# Patient Record
Sex: Male | Born: 1960 | Race: Black or African American | Hispanic: No | Smoking: Never smoker
Health system: Southern US, Community
[De-identification: ages and names within clinical notes are randomized; demographics above are authoritative.]

## PROBLEM LIST (undated history)

## (undated) DIAGNOSIS — I10 Essential (primary) hypertension: Secondary | ICD-10-CM

## (undated) DIAGNOSIS — E119 Type 2 diabetes mellitus without complications: Secondary | ICD-10-CM

---

## 2001-11-02 ENCOUNTER — Emergency Department (HOSPITAL_COMMUNITY): Admission: EM | Admit: 2001-11-02 | Discharge: 2001-11-02 | Payer: Self-pay | Admitting: Emergency Medicine

## 2005-05-23 ENCOUNTER — Emergency Department (HOSPITAL_COMMUNITY): Admission: EM | Admit: 2005-05-23 | Discharge: 2005-05-23 | Payer: Self-pay | Admitting: Family Medicine

## 2005-07-13 ENCOUNTER — Emergency Department (HOSPITAL_COMMUNITY): Admission: EM | Admit: 2005-07-13 | Discharge: 2005-07-13 | Payer: Self-pay | Admitting: Family Medicine

## 2006-01-24 ENCOUNTER — Emergency Department (HOSPITAL_COMMUNITY): Admission: EM | Admit: 2006-01-24 | Discharge: 2006-01-24 | Payer: Self-pay | Admitting: Emergency Medicine

## 2006-03-03 ENCOUNTER — Ambulatory Visit (HOSPITAL_COMMUNITY): Admission: RE | Admit: 2006-03-03 | Discharge: 2006-03-03 | Payer: Self-pay | Admitting: Surgery

## 2006-03-03 ENCOUNTER — Encounter (INDEPENDENT_AMBULATORY_CARE_PROVIDER_SITE_OTHER): Payer: Self-pay | Admitting: Specialist

## 2006-07-23 ENCOUNTER — Emergency Department (HOSPITAL_COMMUNITY): Admission: EM | Admit: 2006-07-23 | Discharge: 2006-07-23 | Payer: Self-pay | Admitting: Emergency Medicine

## 2007-06-08 ENCOUNTER — Emergency Department (HOSPITAL_COMMUNITY): Admission: EM | Admit: 2007-06-08 | Discharge: 2007-06-08 | Payer: Self-pay | Admitting: Family Medicine

## 2008-01-07 ENCOUNTER — Emergency Department (HOSPITAL_COMMUNITY): Admission: EM | Admit: 2008-01-07 | Discharge: 2008-01-07 | Payer: Self-pay | Admitting: Family Medicine

## 2008-04-21 ENCOUNTER — Emergency Department (HOSPITAL_COMMUNITY): Admission: EM | Admit: 2008-04-21 | Discharge: 2008-04-21 | Payer: Self-pay | Admitting: Emergency Medicine

## 2008-07-13 ENCOUNTER — Emergency Department (HOSPITAL_COMMUNITY): Admission: EM | Admit: 2008-07-13 | Discharge: 2008-07-13 | Payer: Self-pay | Admitting: Family Medicine

## 2010-09-03 NOTE — Consult Note (Signed)
NAME:  Ernest Edwards, Ernest Edwards NO.:  [id number]   MEDICAL RECORD NO.:  [id number]          PATIENT TYPE:  EMS   LOCATION:  MAJO                         FACILITY:  [facility]   PHYSICIAN:  Jefry H. Pollyann Kennedy, MD     DATE OF BIRTH:  May 17, 1960   DATE OF CONSULTATION:  DATE OF DISCHARGE:  07/23/2006                                 CONSULTATION   REASON FOR CONSULTATION:  Sore throat, possible peritonsillar abscess.   HISTORY:  This is a 50 year old gentleman with a 6-day history of sore  throat, progressively worsening, mainly on the right side.  No prior  history of throat problems or tonsillitis.  He has been on no  medications.  He has no allergies and takes no medicines.  Has no past  medical history.   PHYSICAL EXAMINATION:  GENERAL:  Healthy appearing gentleman in no  distress.  He has a slight hot potato voice quality.  CERVICAL:  Exam reveals slight fullness of the right upper jugular area  without tenderness.  There is no significant trismus.  The nasal cavity  is clear.  Oral cavity and pharynx reveals edema and fullness of the  right soft palate with displacement of the tonsil and uvula to the left.   UNDERLYING IMPRESSION:  Suspected peritonsillar abscess.   PLAN:  Perform aspiration and incision and drainage if positive.   PROCEDURE NOTE:  Xylocaine with epinephrine was infiltrated into the  right side of the soft palate and anterior fossal arch.  A #18 gauge  needle was used to aspirate the peritonsillar space and about 2 cc of  pus was obtained.  A #11 blade was used to incise the mucosa and a  tonsillar hemostat was used to open the peritonsillar plane completely.  There was no further drainage.  He tolerated this well.   IMPRESSION:  Peritonsillar abscess status post incision and drainage.  He is given a prescription for Augmentin 875 mg p.o. b.i.d. for 10 days.  He is instructed to use Tylenol and/or Motrin for pain.  He is to  followup with me in 2 to 3 days  or sooner if he gets any worse.      Jefry H. Pollyann Kennedy, MD  Electronically Signed     JHR/MEDQ  D:  07/23/2006  T:  07/24/2006  Job:  [id number]

## 2010-09-03 NOTE — Op Note (Signed)
NAME:  Ernest, Edwards NO.:  [id number]   MEDICAL RECORD NO.:  [id number]          PATIENT TYPE:  AMB   LOCATION:  DAY                          FACILITY:  [facility]   PHYSICIAN:  Ardeth Sportsman, MD     DATE OF BIRTH:  12-Sep-1960   DATE OF PROCEDURE:  DATE OF DISCHARGE:                                 OPERATIVE REPORT   SURGEON:  Ardeth Sportsman, MD   ASSISTANT:  Sheppard Plumber. Earlene Plater, M.D.   PREOPERATIVE DIAGNOSIS:  Umbilical hernia.   POSTOPERATIVE DIAGNOSIS:  Incarcerated umbilical hernia.   PROCEDURE PERFORMED:  Open umbilical hernia repair with mesh (Ventralex 6 x  6 cm dual-sided mesh).   ANESTHESIA:  1. General anesthesia.  2. Local anesthetic and field block around the periumbilical region.   SPECIMENS:  Umbilical sac.   DRAINS:  None.   COMPLICATIONS:  None apparent.   ESTIMATED BLOOD LOSS:  Minimal.   INDICATIONS:  [name] is a 50 year old gentleman who has had a chronic  umbilical hernia that has been increasing in size, increasingly symptomatic.  The anatomy embryology of abdominal formation and the pathophysiology of  umbilical herniation with its natural history and risks were discussed in  detail.  The technique of open repair with mesh was discussed versus other  techniques.  Risks such as bleeding, hematoma, wound infection, seroma,  hernia occurrence, injury of bowel or other injuries, hernia, and other  risks were discussed.  Questions were answered.  He wished to proceed.   OPERATIVE FINDINGS:  He had about a 3 x 3 cm (25 x 25 m) circular  periumbilical defect incarcerated with omentum in it.  No evidence of any  strangulation.   DESCRIPTION OF PROCEDURE:  Informed consent was confirmed.  Patient received  IV Acetazolam prior to induction.  He had SCDs active on both lower  extremities.  He underwent general anesthesia without difficulty.  He was  positioned supine with arms out.  Patient's abdomen was prepped and draped  in a  sterile fashion.  Field block was placed with local anesthetic.  A 3 cm  infraumbilical curvilinear incision was done.  Controlled blunt and cautery  dissection was used to circumferentially dissect around the umbilical stalk,  which had an obvious umbilical hernia sac within the stalk itself.  The  stalk was freed off at the level of the fascia and removed off the  umbilicus.  It was sent to pathology.  There was omentum that had been  incarcerated with it, but this is able to be reduced down as the hernia sac  was freed up.  The edges were found to be intact and defect as noted above.   Because of his large size and the above-average size of the umbilical  hernia.  We decided to go ahead and do mesh repair.  A 6 x 6 Ventralex mesh  seemed to be the most appropriate size.  The next size up would be way too  large to actually get good visualization for intraperitoneal repair.  Ventralex was placed in, with rough side on the abdominal wall and the  smooth side on the bottom.  Care was made to make sure there was no exposure  to bowel, and omentum remained inside the peritoneal cavity.  The mesh was  secured to the fascia using 0 Ethibond interrupted stitches x8.  This had  good fascial closure.  The umbilical stalk was reapproximated down to the  inferior edge of the fascia as well as a piece of the mesh as well to good  cosmetic result.  The skin was closed using a 4-0 Monocryl stitch.  Sterile  dressing was applied.  The patient was extubated and sent to the recovery  room in stable condition.   Per the patient's request, I called Ms. Allyne Gee at [phone] and  explained the operative findings and postoperative instructions.  She  expressed understanding and appreciation.      Ardeth Sportsman, MD  Electronically Signed     SCG/MEDQ  D:  03/03/2006  T:  03/03/2006  Job:  [id number]

## 2011-01-07 LAB — I-STAT 8, (EC8 V) (CONVERTED LAB)
Chloride: 102
Glucose, Bld: 359 — ABNORMAL HIGH
Operator id: 116391
Potassium: 4.1
Sodium: 135

## 2011-01-07 LAB — POCT URINALYSIS DIP (DEVICE)
Hgb urine dipstick: NEGATIVE
Ketones, ur: 15 — AB
Urobilinogen, UA: 0.2

## 2011-06-23 ENCOUNTER — Emergency Department (INDEPENDENT_AMBULATORY_CARE_PROVIDER_SITE_OTHER)
Admission: EM | Admit: 2011-06-23 | Discharge: 2011-06-23 | Disposition: A | Discharge status: Home / Self Care | Payer: BC Managed Care – PPO | Source: Home / Self Care | Attending: Family Medicine | Admitting: Family Medicine

## 2011-06-23 ENCOUNTER — Encounter (HOSPITAL_COMMUNITY): Payer: Self-pay | Admitting: Emergency Medicine

## 2011-06-23 DIAGNOSIS — S86119A Strain of other muscle(s) and tendon(s) of posterior muscle group at lower leg level, unspecified leg, initial encounter: Secondary | ICD-10-CM

## 2011-06-23 DIAGNOSIS — S86819A Strain of other muscle(s) and tendon(s) at lower leg level, unspecified leg, initial encounter: Secondary | ICD-10-CM

## 2011-06-23 HISTORY — DX: Essential (primary) hypertension: I10

## 2011-06-23 NOTE — Discharge Instructions (Signed)
Heat to leg, aspirin if desired, stretch, activity as tolerated, return to ER if further problems.

## 2011-06-23 NOTE — ED Provider Notes (Signed)
History     CSN: [id number]  Arrival date & time 06/23/11  1443   First MD Initiated Contact with Patient 06/23/11 1616      Chief Complaint  Patient presents with  . Leg Pain    (Consider location/radiation/quality/duration/timing/severity/associated sxs/prior treatment) Patient is a 51 y.o. male presenting with leg pain. The history is provided by the patient.  Leg Pain  The incident occurred yesterday. The incident occurred at home. There was no injury mechanism (got out of car and when walking had ache /cramp in right calf that lasted few hrs as soreness then resolved, v min soreness now., no risk factors for dvt, does exercise reg.). The pain is present in the right leg. The patient is experiencing no pain. Pertinent negatives include no numbness, no inability to bear weight and no muscle weakness. He reports no foreign bodies present. The symptoms are aggravated by nothing.    Past Medical History  Diagnosis Date  . Hypertension     History reviewed. No pertinent past surgical history.  No family history on file.  History  Substance Use Topics  . Smoking status: Never Smoker   . Smokeless tobacco: Not on file  . Alcohol Use: No      Review of Systems  Constitutional: Negative.   Respiratory: Negative for shortness of breath.   Cardiovascular: Negative for chest pain.  Musculoskeletal: Negative.   Neurological: Negative for numbness.    Allergies  Review of patient's allergies indicates no known allergies.  Home Medications   Current Outpatient Rx  Name Route Sig Dispense Refill  . HYDROCHLOROTHIAZIDE 12.5 MG PO CAPS Oral Take 12.5 mg by mouth daily.      BP 141/95  Pulse 78  Temp(Src) 97.9 F (36.6 C) (Oral)  Resp 18  SpO2 100%  Physical Exam  Nursing note and vitals reviewed. Constitutional: He is oriented to person, place, and time. He appears well-developed and well-nourished.  Musculoskeletal: He exhibits no edema and no tenderness.   Legs: Neurological: He is alert and oriented to person, place, and time.  Skin: Skin is warm and dry.    ED Course  Procedures (including critical care time)  Labs Reviewed - No data to display No results found.   No diagnosis found.    MDM          Barkley Bruns, MD 06/23/11 1728

## 2011-06-23 NOTE — ED Notes (Signed)
PT HERE WITH RIGHT CALF SUDDEN ACHY PAIN AT REST AND AMBULATION THAT STARTED YESTERDAY THEN RESOLVED BUT RESTARTED TODAY.DENIES SOB OR SWELLING.PT STATES HE IS VERY ACTIVE AND DIDN'T DO ANY LOWER BODY EXERCISES AT THE GYM.CONCERNED OF BLOOD CLOT.NO TREATMENT TRIED AT HOME.2/10 PAIN

## 2014-02-21 ENCOUNTER — Emergency Department (HOSPITAL_COMMUNITY)
Admission: EM | Admit: 2014-02-21 | Discharge: 2014-02-21 | Disposition: A | Discharge status: Home / Self Care | Payer: BC Managed Care – PPO | Attending: Emergency Medicine | Admitting: Emergency Medicine

## 2014-02-21 ENCOUNTER — Encounter (HOSPITAL_COMMUNITY): Payer: Self-pay | Admitting: *Deleted

## 2014-02-21 DIAGNOSIS — Z79899 Other long term (current) drug therapy: Secondary | ICD-10-CM | POA: Insufficient documentation

## 2014-02-21 DIAGNOSIS — E1165 Type 2 diabetes mellitus with hyperglycemia: Secondary | ICD-10-CM | POA: Diagnosis present

## 2014-02-21 DIAGNOSIS — I10 Essential (primary) hypertension: Secondary | ICD-10-CM | POA: Insufficient documentation

## 2014-02-21 DIAGNOSIS — R739 Hyperglycemia, unspecified: Secondary | ICD-10-CM

## 2014-02-21 HISTORY — DX: Type 2 diabetes mellitus without complications: E11.9

## 2014-02-21 LAB — URINE MICROSCOPIC-ADD ON

## 2014-02-21 LAB — BASIC METABOLIC PANEL
Anion gap: 12 (ref 5–15)
BUN: 14 mg/dL (ref 6–23)
CALCIUM: 10 mg/dL (ref 8.4–10.5)
CO2: 27 meq/L (ref 19–32)
Chloride: 94 mEq/L — ABNORMAL LOW (ref 96–112)
Creatinine, Ser: 0.95 mg/dL (ref 0.50–1.35)
Glucose, Bld: 352 mg/dL — ABNORMAL HIGH (ref 70–99)
POTASSIUM: 4.7 meq/L (ref 3.7–5.3)
SODIUM: 133 meq/L — AB (ref 137–147)

## 2014-02-21 LAB — CBC WITH DIFFERENTIAL/PLATELET
BASOS ABS: 0 10*3/uL (ref 0.0–0.1)
Basophils Relative: 1 % (ref 0–1)
Eosinophils Absolute: 0.2 10*3/uL (ref 0.0–0.7)
Eosinophils Relative: 4 % (ref 0–5)
HEMATOCRIT: 41.3 % (ref 39.0–52.0)
Hemoglobin: 14.7 g/dL (ref 13.0–17.0)
Lymphocytes Relative: 47 % — ABNORMAL HIGH (ref 12–46)
Lymphs Abs: 2.2 10*3/uL (ref 0.7–4.0)
MCH: 27.6 pg (ref 26.0–34.0)
MCHC: 35.6 g/dL (ref 30.0–36.0)
MCV: 77.5 fL — AB (ref 78.0–100.0)
MONO ABS: 0.4 10*3/uL (ref 0.1–1.0)
Monocytes Relative: 9 % (ref 3–12)
NEUTROS ABS: 1.8 10*3/uL (ref 1.7–7.7)
NEUTROS PCT: 39 % — AB (ref 43–77)
PLATELETS: 250 10*3/uL (ref 150–400)
RBC: 5.33 MIL/uL (ref 4.22–5.81)
RDW: 12.5 % (ref 11.5–15.5)
WBC: 4.5 10*3/uL (ref 4.0–10.5)

## 2014-02-21 LAB — CBG MONITORING, ED
GLUCOSE-CAPILLARY: 325 mg/dL — AB (ref 70–99)
GLUCOSE-CAPILLARY: 237 mg/dL — AB (ref 70–99)

## 2014-02-21 LAB — URINALYSIS, ROUTINE W REFLEX MICROSCOPIC
BILIRUBIN URINE: NEGATIVE
Glucose, UA: >1000 mg/dL — AB
Hgb urine dipstick: NEGATIVE
Ketones, ur: NEGATIVE mg/dL
LEUKOCYTES UA: NEGATIVE
Nitrite: NEGATIVE
PH: 6 (ref 5.0–8.0)
PROTEIN: NEGATIVE mg/dL
SPECIFIC GRAVITY, URINE: 1.037 — AB (ref 1.005–1.030)
Urobilinogen, UA: 0.2 mg/dL (ref 0.0–1.0)

## 2014-02-21 MED ORDER — METFORMIN HCL 500 MG PO TABS: 500.0000 mg | ORAL_TABLET | Freq: Two times a day (BID) | ORAL | Status: DC

## 2014-02-21 MED ORDER — HYDROCHLOROTHIAZIDE 12.5 MG PO CAPS: 12.5000 mg | ORAL_CAPSULE | Freq: Every day | ORAL | Status: DC

## 2014-02-21 MED ORDER — SODIUM CHLORIDE 0.9 % IV BOLUS (SEPSIS)
1000.0000 mL | Freq: Once | INTRAVENOUS | Status: AC
Start: 2014-02-21 — End: 2014-02-21
  Administered 2014-02-21: 1000 mL via INTRAVENOUS

## 2014-02-21 NOTE — ED Provider Notes (Signed)
CSN: [id number]     Arrival date & time 02/21/14  0845 History   First MD Initiated Contact with Patient 02/21/14 [phone]     Chief Complaint  Patient presents with  . Hyperglycemia     (Consider location/radiation/quality/duration/timing/severity/associated sxs/prior Treatment) HPI Comments: Patient with a history of DM presents today with a chief complaint of hyperglycemia.  He reports that he typically checks his blood sugar once a month.  Blood sugars typically run in the 200's.  Yesterday he checked it and it was 440.  He had been on Metformin previously, but stopped taking it four months ago.  He also reports that he stopped taking his blood pressure medication four months ago.  He reports that he has been trying to control his DM and HTN with diet and exercise.  He denies nausea, vomiting, diarrhea, SOB, chest pain, headache, fever, chills, or vision changes.    The history is provided by the patient.    Past Medical History  Diagnosis Date  . Hypertension   . Diabetes mellitus without complication    History reviewed. No pertinent past surgical history. History reviewed. No pertinent family history. History  Substance Use Topics  . Smoking status: Never Smoker   . Smokeless tobacco: Not on file  . Alcohol Use: No    Review of Systems  All other systems reviewed and are negative.     Allergies  Review of patient's allergies indicates no known allergies.  Home Medications   Prior to Admission medications   Medication Sig Start Date End Date Taking? Authorizing Provider  hydrochlorothiazide (MICROZIDE) 12.5 MG capsule Take 12.5 mg by mouth daily.    Historical Provider, MD   BP 150/99 mmHg  Pulse 89  Temp(Src) 98.3 F (36.8 C) (Oral)  Resp 19  SpO2 100% Physical Exam  Constitutional: He appears well-developed and well-nourished.  HENT:  Head: Normocephalic and atraumatic.  Mouth/Throat: Oropharynx is clear and moist.  Neck: Normal range of motion. Neck supple.   Cardiovascular: Normal rate, regular rhythm and normal heart sounds.   Pulmonary/Chest: Effort normal and breath sounds normal.  Abdominal: Soft. Bowel sounds are normal. He exhibits no distension and no mass. There is no tenderness. There is no rebound and no guarding.  Musculoskeletal: Normal range of motion.  Neurological: He is alert.  Skin: Skin is warm and dry. He is not diaphoretic.  Psychiatric: He has a normal mood and affect.  Nursing note and vitals reviewed.   ED Course  Procedures (including critical care time) Labs Review Labs Reviewed  CBC WITH DIFFERENTIAL - Abnormal; Notable for the following:    MCV 77.5 (*)    Neutrophils Relative % 39 (*)    Lymphocytes Relative 47 (*)    All other components within normal limits  BASIC METABOLIC PANEL - Abnormal; Notable for the following:    Sodium 133 (*)    Chloride 94 (*)    Glucose, Bld 352 (*)    All other components within normal limits  URINALYSIS, ROUTINE W REFLEX MICROSCOPIC - Abnormal; Notable for the following:    Specific Gravity, Urine 1.037 (*)    Glucose, UA >1000 (*)    All other components within normal limits  URINE MICROSCOPIC-ADD ON - Abnormal; Notable for the following:    Casts GRANULAR CAST (*)    All other components within normal limits  CBG MONITORING, ED - Abnormal; Notable for the following:    Glucose-Capillary 325 (*)    All other components within normal  limits  CBG MONITORING, ED - Abnormal; Notable for the following:    Glucose-Capillary 237 (*)    All other components within normal limits    Imaging Review No results found.   EKG Interpretation None      MDM   Final diagnoses:  None    Patient with a history of DM presents today with a chief complaint of hyperglycemia.  Initial blood sugar is 352.  Patient reports that he has not taken his Metformin in 4 months.  Anion gap of 12.  No ketones in the Urine.  Patient currently asymptomatic.  Therefore, do not feel that the  patient is in DKA.  Blood sugar decreased to 237 after he was given IVF.  Feel that the patient is stable for discharge.  Patient given Rx for Metformin.  Patient has also not taken his HCTZ in the past 4 months.  Patient also given Rx for HCTZ.  Patient also [facility].  Stable for discharge.  Return precautions given.    Santiago GladHeather Ichelle Harral, PA-C 02/21/14 1218  Warnell Foresterrey Wofford, MD 02/21/14 [phone]

## 2014-02-21 NOTE — Discharge Instructions (Signed)
Hyperglycemia °Hyperglycemia occurs when the glucose (sugar) in your blood is too high. Hyperglycemia can happen for many reasons, but it most often happens to people who do not know they have diabetes or are not managing their diabetes properly.  °CAUSES  °Whether you have diabetes or not, there are other causes of hyperglycemia. Hyperglycemia can occur when you have diabetes, but it can also occur in other situations that you might not be as aware of, such as: °Diabetes °· If you have diabetes and are having problems controlling your blood glucose, hyperglycemia could occur because of some of the following reasons: °¨ Not following your meal plan. °¨ Not taking your diabetes medications or not taking it properly. °¨ Exercising less or doing less activity than you normally do. °¨ Being sick. °Pre-diabetes °· This cannot be ignored. Before people develop Type 2 diabetes, they almost always have "pre-diabetes." This is when your blood glucose levels are higher than normal, but not yet high enough to be diagnosed as diabetes. Research has shown that some long-term damage to the body, especially the heart and circulatory system, may already be occurring during pre-diabetes. If you take action to manage your blood glucose when you have pre-diabetes, you may delay or prevent Type 2 diabetes from developing. °Stress °· If you have diabetes, you may be "diet" controlled or on oral medications or insulin to control your diabetes. However, you may find that your blood glucose is higher than usual in the hospital whether you have diabetes or not. This is often referred to as "stress hyperglycemia." Stress can elevate your blood glucose. This happens because of hormones put out by the body during times of stress. If stress has been the cause of your high blood glucose, it can be followed regularly by your caregiver. That way he/she can make sure your hyperglycemia does not continue to get worse or progress to  diabetes. °Steroids °· Steroids are medications that act on the infection fighting system (immune system) to block inflammation or infection. One side effect can be a rise in blood glucose. Most people can produce enough extra insulin to allow for this rise, but for those who cannot, steroids make blood glucose levels go even higher. It is not unusual for steroid treatments to "uncover" diabetes that is developing. It is not always possible to determine if the hyperglycemia will go away after the steroids are stopped. A special blood test called an A1c is sometimes done to determine if your blood glucose was elevated before the steroids were started. °SYMPTOMS °· Thirsty. °· Frequent urination. °· Dry mouth. °· Blurred vision. °· Tired or fatigue. °· Weakness. °· Sleepy. °· Tingling in feet or leg. °DIAGNOSIS  °Diagnosis is made by monitoring blood glucose in one or all of the following ways: °· A1c test. This is a chemical found in your blood. °· Fingerstick blood glucose monitoring. °· Laboratory results. °TREATMENT  °First, knowing the cause of the hyperglycemia is important before the hyperglycemia can be treated. Treatment may include, but is not be limited to: °· Education. °· Change or adjustment in medications. °· Change or adjustment in meal plan. °· Treatment for an illness, infection, etc. °· More frequent blood glucose monitoring. °· Change in exercise plan. °· Decreasing or stopping steroids. °· Lifestyle changes. °HOME CARE INSTRUCTIONS  °· Test your blood glucose as directed. °· Exercise regularly. Your caregiver will give you instructions about exercise. Pre-diabetes or diabetes which comes on with stress is helped by exercising. °· Eat wholesome,   balanced meals. Eat often and at regular, fixed times. Your caregiver or nutritionist will give you a meal plan to guide your sugar intake. °· Being at an ideal weight is important. If needed, losing as little as 10 to 15 pounds may help improve blood  glucose levels. °SEEK MEDICAL CARE IF:  °· You have questions about medicine, activity, or diet. °· You continue to have symptoms (problems such as increased thirst, urination, or weight gain). °SEEK IMMEDIATE MEDICAL CARE IF:  °· You are vomiting or have diarrhea. °· Your breath smells fruity. °· You are breathing faster or slower. °· You are very sleepy or incoherent. °· You have numbness, tingling, or pain in your feet or hands. °· You have chest pain. °· Your symptoms get worse even though you have been following your caregiver's orders. °· If you have any other questions or concerns. °Document Released: 09/28/2000 Document Revised: 06/27/2011 Document Reviewed: 08/01/2011 °ExitCare® Patient Information ©2015 [employer]. This information is not intended to replace advice given to you by your health care provider. Make sure you discuss any questions you have with your health care provider. ° ° °Emergency Department Resource Guide °1) Find a Doctor and Pay Out of Pocket °Although you won't have to find out who is covered by your insurance plan, it is a good idea to ask around and get recommendations. You will then need to call the office and see if the doctor you have chosen will accept you as a new patient and what types of options they offer for patients who are self-pay. Some doctors offer discounts or will set up payment plans for their patients who do not have insurance, but you will need to ask so you aren't surprised when you get to your appointment. ° °2) Contact Your Local Health Department °Not all health departments have doctors that can see patients for sick visits, but many do, so it is worth a call to see if yours does. If you don't know where your local health department is, you can check in your phone book. The CDC also has a tool to help you locate your state's health department, and many state websites also have listings of all of their local health departments. ° °3) Find a Walk-in Clinic °If  your illness is not likely to be very severe or complicated, you may want to try a walk in clinic. These are popping up all over the country in pharmacies, drugstores, and shopping centers. They're usually staffed by nurse practitioners or physician assistants that have been trained to treat common illnesses and complaints. They're usually fairly quick and inexpensive. However, if you have serious medical issues or chronic medical problems, these are probably not your best option. ° °No Primary Care Doctor: °- Call [facility] at  832-8000 - they can help you locate a primary care doctor that  accepts your insurance, provides certain services, etc. °- Physician Referral Service- 1-800-533-3463 ° °Chronic Pain Problems: °Organization         Address  Phone   Notes  °[facility]  (336) 297-2271 Patients need to be referred by their primary care doctor.  ° °Medication Assistance: °Organization         Address  Phone   Notes  °[place] [facility] [address] °[place], [place] [address] (336) 641-8030 --Must be a resident of [place] °-- Must have NO insurance coverage whatsoever (no Medicaid/ Medicare, etc.) °-- The pt. MUST have   a primary care doctor that directs their care regularly and follows them in the community °  °[facility]  (866) 331-1348   °[employer]  (888) 892-1162   ° °Agencies that provide inexpensive medical care: °Organization         Address  Phone   Notes  °[facility]  (336) 832-8035   °[facility]    (336) 832-7272   °[facility] [address] °[place], [place] [address] (336) 832-4777   °[facility] [address], °[place] (336) 271-4999   °[facility]    (336) 373-0678   °[facility]    (336) 272-1050   °[facility] ° [address], [place] Phone:  (336) 832-4444, Fax:  (336) 832-4440 Hours of  Operation:  9 am - 6 pm, M-F.  Also accepts Medicaid/Medicare and self-pay.  °[facility] ° [address], [place] Phone: (336) [phone], Fax: (336) [phone]. Hours of Operation:  8:30 am - 5:30 pm, M-F.  Also accepts Medicaid and self-pay.  °[facility] [place] [address], [place] Phone: (336) 878-6027   °[facility] [address], [place], [place] (336)723-1848, Ext. 123 Mondays & Thursdays: 7-9 AM.  First 15 patients are seen on a first come, first serve basis. °  ° °Medicaid-accepting [place] Providers: ° °Organization         Address  Phone   Notes  °[facility] [address], [place] (336) 641-2100 Also accepts self-pay patients.  °[facility] [address], [place] ° (336) 856-9996   °[facility] [address], [place] (336) 288-8857   °[facility] [address] [place] [address], [place] (336) 299-7000   °[facility] [address], [place]  ° (336) 373-1557 Only accepts [place] Access Medicaid patients after they have their name applied to their card.  ° °Self-Pay (no insurance) in [place]: ° °Organization         Address  Phone   Notes  °Sickle Cell Patients, [facility] [address], [place] (336) 832-1970   °[facility] [address], [place] (336) 832-4400   °[facility] ° [address], [place] (336) 992-4800   °[facility]/Dr. Osei-Bonsu ° [address] [place] [address], [place] or [address], [place] (336) [phone] Phone number for both [place] and [place] locations is the same.  °[facility] [address], [place] (336) 299-0000   °[facility] [address] [place] [address],  or [address] (336) 852-7530 °(336) 878-2260   °[facility] [address],  (336) 350-1642, phone; (336) 294-5005, fax Sees patients 1st and 3rd Saturday of every month.  Must not qualify for public or private insurance (i.e. Medicaid, Medicare,  Health Choice, Veterans' Benefits) • Household income should be no more than 200% of the poverty level •The clinic cannot treat you if you are pregnant or think you are pregnant • Sexually transmitted diseases are not treated at the clinic.  ° ° °Dental Care: °Organization         Address  Phone  Notes  °[facility] [address],  (  336) 641-6152 Accepts children up to age 21 who are enrolled in Medicaid or [place] Health Choice; pregnant women with a Medicaid card; and children who have applied for Medicaid or [place] Health Choice, but were declined, whose parents can pay a reduced fee at time of service.  °[facility] [place]  [address], [place] (336) 641-7733 Accepts children up to age 21 who are enrolled in Medicaid or [place] Health Choice; pregnant women with a Medicaid card; and children who have applied for Medicaid or [place] Health Choice, but were declined, whose parents can pay a reduced fee at time of service.  °[facility] ° [address], [place] (336) 641-4533 Patients are seen by appointment only. Walk-ins are not accepted. [facility] will see patients 18 years of age and older. °Monday - Tuesday (8am-5pm) °Most Wednesdays (8:30-5pm) °$30 per visit, cash only  °[facility] ° [address], [place] (336) 641-4533 Patients are seen by appointment only. Walk-ins are not accepted. [facility] will see patients 18 years of age and older. °One Wednesday Evening (Monthly: Volunteer Based).  $30 per visit, cash only  °[facility]  (919) [phone] for adults; Children under age 4, call Graduate Pediatric  Dentistry at ([phone]) [phone]. Children aged 4-14, please call ([phone]) [phone] to request a pediatric application. ° Dental services are provided in all areas of dental care including fillings, crowns and bridges, complete and partial dentures, implants, gum treatment, root canals, and extractions. Preventive care is also provided. Treatment is provided to both adults and children. °Patients are selected via a lottery and there is often a waiting list. °  °[facility] [address], °[place] ° (336) 763-8833 [email or web address] °  °[facility] [address], [place], [place] (336)723-1848, Ext. 123 Second and Fourth Thursday of each month, opens at 6:30 AM; Clinic ends at 9 AM.  Patients are seen on a first-come first-served basis, and a limited number are seen during each clinic.  ° °[facility] ° [address], [place], [place] (336) 723-7904   Eligibility Requirements °You must have lived in [place], [place], or [place] counties for at least the last three months. °  You cannot be eligible for state or federal sponsored healthcare insurance, including Veterans Administration, Medicaid, or Medicare. °  You generally cannot be eligible for healthcare insurance through your employer.  °  How to apply: °Eligibility screenings are held every Tuesday and Wednesday afternoon from 1:00 pm until 4:00 pm. You do not need an appointment for the interview!  °[facility] [address], [place], [place] 336-631-2330   °[facility]  336-342-8273   °[facility]  336-703-3100   °[facility]  336-570-6415   ° °Behavioral Health Resources in the Community: °Intensive Outpatient Programs °Organization         Address  Phone  Notes  °[facility] [address], [place], [place] 336-878-6098   °[facility] [address], [place], [place] 336-832-9800   °[facility] [address], [place], [place] ° 336-882-2125   °[facility] [address],  °[place], [place] 1-800-853-5163 or 336-641-4981   °Substance Abuse Resources °Organization         Address  Phone  Notes  °[facility]    336-882-2125   °[facility]  336-784-9470   °[facility]  336-285-9073   °[facility]  336-845-3988   °Residential & Outpatient Substance Abuse Program  1-800-659-3381   °Psychological Services °Organization         Address  Phone  Notes  °[facility]  336- 832-9600   °[facility]  336- 378-7881   °[facility] [address], [place] 1-800-853-5163 or 336-641-4981   ° °Mobile Crisis Teams °Organization         Address  Phone  Notes  °[facility], Mobile Crisis Care Unit  1-877-626-1772   °Assertive °[facility] ° [address] [place], [place] 336-834-9664   °Sharon DeEsch [address] °[place] 336-554-5454   ° °Self-Help/Support Groups °Organization         Address  Phone             Notes  °[facility] - variety of support groups  336- 373-1402 Call for more information  °Narcotics Anonymous (NA), [facility] [address], °[place]  2 meetings at this location  ° °Residential Treatment Programs °Organization         Address  Phone  Notes  °[facility] [address],    °[place]  1-866-801-8205   °[facility] ° [address], [place], [place] 704-293-8524   °[facility] [address], [place] 336-845-3988 Admissions: 8am-3pm M-F  °[facility] [address]    °[place], [place] 336-841-1104   °[facility] [address], [place], [place] 336-379-7146   °[facility] [address]  °[place], [place] 336-285-9073   °[facility] Intensive Outpatient [address],  [place], [place] 336-852-3033   °[facility] [address]  °[place], [place] 1-877-615-2722 or 336-784-9470   °[facility] [address] [place], [place] 336-227-7417 Accepts Medicaid  °[facility] [address]  °[place] 1-800-659-3381 Substance Abuse/Addiction Treatment  ° °[place] Behavioral Health Resources °Organization         Address  Phone  Notes  °[facility]  (888) 581-9988   °Julie Brannon, PhD [address] [place], [place]   (336) 349-5553 or (336) 951-0000   °[facility]   [address] °[place], [place] (336) 349-4454   °[facility] [address], [place], [place] (336) 342-8316 Insurance/Medicaid/sponsorship through [facility]  °[facility] [address]                                    [place], [place] (336) 342-8316 Therapy/tele-psych/case  °[facility] [address]  ° [place], [place] (336) 349-2233    °Dr. Arfeen  (336) 349-4544   °[facility] [place]  [employer] [place] [facility] 1) [address], [place] °2) [address], [place] °3)  [address], [place] (336) 349-3220 °(336) 342-7768 ° °(336) 342-8140   °[place] [facility] (336) 342-1394 or (336) 342-3537 (After Hours)    ° ° °

## 2014-02-21 NOTE — ED Notes (Signed)
Pt comfortable with discharge and follow up instructions. Pt declines wheelchair, escorted to waiting area by this RN. Prescriptions x2. 

## 2014-02-21 NOTE — ED Notes (Addendum)
Itchy feeling  On rt flank and left mostly at night x 2 -3 days comes and goes. Daytime the  itching is  better. States not taking any meds( metformin) for his diabetes x 4 months. He just quit taking it . Has been trying to control w/ diet and exercise. Denies frequent urination has been losing  Wt, 10 # lost in last week he states . Did not think to start his meds again also states  That he has not been taking his bp meds also

## 2014-02-21 NOTE — ED Notes (Signed)
Pt reports not feeling well x 2 days. Reports itching to his body and stiffness to right side. Checked cbg at home and reports >400 but denies any n/v or any other symptoms. No acute distress noted at triage.

## 2014-02-22 ENCOUNTER — Emergency Department (HOSPITAL_COMMUNITY)
Admission: EM | Admit: 2014-02-22 | Discharge: 2014-02-22 | Disposition: A | Discharge status: Home / Self Care | Payer: BC Managed Care – PPO | Attending: Emergency Medicine | Admitting: Emergency Medicine

## 2014-02-22 ENCOUNTER — Encounter (HOSPITAL_COMMUNITY): Payer: Self-pay | Admitting: Family Medicine

## 2014-02-22 DIAGNOSIS — Y929 Unspecified place or not applicable: Secondary | ICD-10-CM | POA: Diagnosis not present

## 2014-02-22 DIAGNOSIS — I10 Essential (primary) hypertension: Secondary | ICD-10-CM | POA: Diagnosis not present

## 2014-02-22 DIAGNOSIS — X58XXXA Exposure to other specified factors, initial encounter: Secondary | ICD-10-CM | POA: Diagnosis not present

## 2014-02-22 DIAGNOSIS — Y939 Activity, unspecified: Secondary | ICD-10-CM | POA: Diagnosis not present

## 2014-02-22 DIAGNOSIS — S29012A Strain of muscle and tendon of back wall of thorax, initial encounter: Secondary | ICD-10-CM

## 2014-02-22 DIAGNOSIS — S39012A Strain of muscle, fascia and tendon of lower back, initial encounter: Secondary | ICD-10-CM | POA: Insufficient documentation

## 2014-02-22 DIAGNOSIS — E119 Type 2 diabetes mellitus without complications: Secondary | ICD-10-CM | POA: Diagnosis not present

## 2014-02-22 DIAGNOSIS — Z79899 Other long term (current) drug therapy: Secondary | ICD-10-CM | POA: Diagnosis not present

## 2014-02-22 DIAGNOSIS — R109 Unspecified abdominal pain: Secondary | ICD-10-CM

## 2014-02-22 MED ORDER — KETOROLAC TROMETHAMINE 60 MG/2ML IM SOLN
60.0000 mg | Freq: Once | INTRAMUSCULAR | Status: AC
  Administered 2014-02-22: 60 mg via INTRAMUSCULAR
  Filled 2014-02-22: qty 2

## 2014-02-22 MED ORDER — CYCLOBENZAPRINE HCL 10 MG PO TABS: 10.0000 mg | ORAL_TABLET | Freq: Two times a day (BID) | ORAL | Status: AC | PRN

## 2014-02-22 MED ORDER — CYCLOBENZAPRINE HCL 10 MG PO TABS
10.0000 mg | ORAL_TABLET | Freq: Once | ORAL | Status: AC
  Administered 2014-02-22: 10 mg via ORAL
  Filled 2014-02-22: qty 1

## 2014-02-22 NOTE — ED Provider Notes (Signed)
CSN: [id number]     Arrival date & time 02/22/14  [id number] History   First MD Initiated Contact with Patient 02/22/14 [phone]     Chief Complaint  Patient presents with  . Flank Pain     (Consider location/radiation/quality/duration/timing/severity/associated sxs/prior Treatment) Patient is a 53 y.o. male presenting with flank pain. The history is provided by the patient. No language interpreter was used.  Flank Pain This is a new problem. The current episode started in the past 7 days. The problem occurs intermittently. The problem has been unchanged. Pertinent negatives include no abdominal pain, chest pain, congestion, coughing, fever, headaches, nausea, rash, sore throat, urinary symptoms or vomiting. Exacerbated by: laying. He has tried NSAIDs for the symptoms. The treatment provided mild relief.    Past Medical History  Diagnosis Date  . Hypertension   . Diabetes mellitus without complication    History reviewed. No pertinent past surgical history. History reviewed. No pertinent family history. History  Substance Use Topics  . Smoking status: Never Smoker   . Smokeless tobacco: Not on file  . Alcohol Use: No    Review of Systems  Constitutional: Negative for fever.  HENT: Negative for congestion, rhinorrhea and sore throat.   Respiratory: Negative for cough and shortness of breath.   Cardiovascular: Negative for chest pain.  Gastrointestinal: Negative for nausea, vomiting, abdominal pain and diarrhea.  Genitourinary: Positive for flank pain. Negative for dysuria and hematuria.  Musculoskeletal: Positive for back pain.  Skin: Negative for rash.  Neurological: Negative for syncope, light-headedness and headaches.  All other systems reviewed and are negative.     Allergies  Review of patient's allergies indicates no known allergies.  Home Medications   Prior to Admission medications   Medication Sig Start Date End Date Taking? Authorizing Provider  hydrochlorothiazide  (MICROZIDE) 12.5 MG capsule Take 1 capsule (12.5 mg total) by mouth daily. 02/21/14   Heather Laisure, PA-C  metFORMIN (GLUCOPHAGE) 500 MG tablet Take 1 tablet (500 mg total) by mouth 2 (two) times daily with a meal. 02/21/14   Heather Laisure, PA-C  Multiple Vitamins-Minerals (MULTIVITAMIN WITH MINERALS) tablet Take 1 tablet by mouth daily.    Historical Provider, MD   BP 164/104 mmHg  Pulse 92  Temp(Src) 98.3 F (36.8 C)  Resp 18  Ht 6\' 2"  (1.88 m)  Wt 230 lb (104.327 kg)  BMI 29.52 kg/m2  SpO2 100% Physical Exam  Constitutional: He is oriented to person, place, and time. He appears well-developed and well-nourished.  HENT:  Head: Normocephalic and atraumatic.  Right Ear: External ear normal.  Left Ear: External ear normal.  Eyes: EOM are normal.  Neck: Normal range of motion. Neck supple.  Cardiovascular: Normal rate, regular rhythm and intact distal pulses.  Exam reveals no gallop and no friction rub.   No murmur heard. Pulmonary/Chest: Effort normal and breath sounds normal. No respiratory distress. He has no wheezes. He has no rales. He exhibits no tenderness.  Abdominal: Soft. Bowel sounds are normal. He exhibits no distension. There is tenderness (right flank). There is no rebound.  Negative murphy's, no CVA tenderness  Musculoskeletal: Normal range of motion. He exhibits tenderness (right paraspinal muscle tenderness). He exhibits no edema.  Lymphadenopathy:    He has no cervical adenopathy.  Neurological: He is alert and oriented to person, place, and time.  Normal speech and cognition  Skin: Skin is warm. No rash noted.  Psychiatric: He has a normal mood and affect. His behavior is normal.  Nursing note  and vitals reviewed.   ED Course  Procedures (including critical care time) Labs Review Labs Reviewed - No data to display  Imaging Review No results found.   EKG Interpretation None      MDM   Final diagnoses:  None    8:43 AM Pt is a 53 y.o. male with  pertinent PMHX of DM, HTN who presents to the ED with right flank pain. Pain off and on for 3-4 days. Worse with laying flat. No falls, injuries or trauma. No nausea vomiting or diarrhea. No focal weakness. No blood in urine. No history of kidney stones or gall stones. Denies chest pain or shortness of breath. No fevers or recent illness. Tried advil with no relief of pain. Works lifting heavy objects. Seen yesterday for similar. No radiation into groin  On exam: well appearing, no CVA tenderness. Some tenderness right paraspinal muscle and into scapula. Non focal neurologic exam: no motor weakness in bilateral upper and lower extremities. No midline point tenderness  Review of labs yesterday: no hemturia, no evidence of UTI. Cr and BUN within normal limits on CMP. Patient with musculoskeletal right back pain. No midline tenderness. No new trauma to warrant emergent imaging. No red flags: denies IVDU. No weakness or numbness. No incontinence. No significant risk factors for UTI or nephrolithasis. Plan for IM toradol and flexeril.  Plan for discharge with scheduled NSAIDs and flexeril as needed with strict return precautions. Patient to see PCP in 1 week  9:22 AM:I have discussed the diagnosis/risks/treatment options with the patient and believe the pt to be eligible for discharge home to follow-up with PCP in 1 week. We also discussed returning to the ED immediately if new or worsening sx occur. We discussed the sx which are most concerning (e.g., worsening symptoms) that necessitate immediate return. Any new prescriptions provided to the patient are listed below.   New Prescriptions   CYCLOBENZAPRINE (FLEXERIL) 10 MG TABLET    Take 1 tablet (10 mg total) by mouth 2 (two) times daily as needed for muscle spasms.    The patient appears reasonably screened and/or stabilized for discharge and I doubt any other medical condition or other [facility] requiring further screening, evaluation or treatment in the ED at  this time prior to discharge . Pt in agreement with discharge plan. Return precautions given. Pt discharged VSS . Pt was discussed with my attending, Dr. Ron Ageeelo     Boneta Standre Peter Nashanti Duquette, MD 02/22/14 [id number]  Geoffery Lyonsouglas Delo, MD 02/22/14 [phone]

## 2014-02-22 NOTE — ED Notes (Signed)
Per pt sts that was seen here yesterday and still having right flank pain. Describes as a dull ache. Denies increased pain with movement or breathing.

## 2014-02-22 NOTE — Discharge Instructions (Signed)
1. Motrin 600mg  3 times a day for 2-3 days with food and then as needed every 6 hours 2. Flexeril as needed 3. Take it easy the next two days 4. Come back if worsening symptoms 5. See PCP in 1 week Back Exercises Back exercises help treat and prevent back injuries. The goal of back exercises is to increase the strength of your abdominal and back muscles and the flexibility of your back. These exercises should be started when you no longer have back pain. Back exercises include:  Pelvic Tilt. Lie on your back with your knees bent. Tilt your pelvis until the lower part of your back is against the floor. Hold this position 5 to 10 sec and repeat 5 to 10 times.  Knee to Chest. Pull first 1 knee up against your chest and hold for 20 to 30 seconds, repeat this with the other knee, and then both knees. This may be done with the other leg straight or bent, whichever feels better.  Sit-Ups or Curl-Ups. Bend your knees 90 degrees. Start with tilting your pelvis, and do a partial, slow sit-up, lifting your trunk only 30 to 45 degrees off the floor. Take at least 2 to 3 seconds for each sit-up. Do not do sit-ups with your knees out straight. If partial sit-ups are difficult, simply do the above but with only tightening your abdominal muscles and holding it as directed.  Hip-Lift. Lie on your back with your knees flexed 90 degrees. Push down with your feet and shoulders as you raise your hips a couple inches off the floor; hold for 10 seconds, repeat 5 to 10 times.  Back arches. Lie on your stomach, propping yourself up on bent elbows. Slowly press on your hands, causing an arch in your low back. Repeat 3 to 5 times. Any initial stiffness and discomfort should lessen with repetition over time.  Shoulder-Lifts. Lie face down with arms beside your body. Keep hips and torso pressed to floor as you slowly lift your head and shoulders off the floor. Do not overdo your exercises, especially in the beginning.  Exercises may cause you some mild back discomfort which lasts for a few minutes; however, if the pain is more severe, or lasts for more than 15 minutes, do not continue exercises until you see your caregiver. Improvement with exercise therapy for back problems is slow.  See your caregivers for assistance with developing a proper back exercise program. Document Released: 05/12/2004 Document Revised: 06/27/2011 Document Reviewed: 02/03/2011 [facility] Patient Information 2015 [employer]. This information is not intended to replace advice given to you by your health care provider. Make sure you discuss any questions you have with your health care provider.  Back Pain, Adult Low back pain is very common. About 1 in 5 people have back pain.The cause of low back pain is rarely dangerous. The pain often gets better over time.About half of people with a sudden onset of back pain feel better in just 2 weeks. About 8 in 10 people feel better by 6 weeks.  CAUSES Some common causes of back pain include:  Strain of the muscles or ligaments supporting the spine.  Wear and tear (degeneration) of the spinal discs.  Arthritis.  Direct injury to the back. DIAGNOSIS Most of the time, the direct cause of low back pain is not known.However, back pain can be treated effectively even when the exact cause of the pain is unknown.Answering your caregiver's questions about your overall health and symptoms is one  of the most accurate ways to make sure the cause of your pain is not dangerous. If your caregiver needs more information, he or she may order lab work or imaging tests (X-rays or MRIs).However, even if imaging tests show changes in your back, this usually does not require surgery. HOME CARE INSTRUCTIONS For many people, back pain returns.Since low back pain is rarely dangerous, it is often a condition that people can learn to [facility]manageon their own.   Remain active. It is stressful on the back to sit or  stand in one place. Do not sit, drive, or stand in one place for more than 30 minutes at a time. Take short walks on level surfaces as soon as pain allows.Try to increase the length of time you walk each day.  Do not stay in bed.Resting more than 1 or 2 days can delay your recovery.  Do not avoid exercise or work.Your body is made to move.It is not dangerous to be active, even though your back may hurt.Your back will likely heal faster if you return to being active before your pain is gone.  Pay attention to your body when you bend and lift. Many people have less discomfortwhen lifting if they bend their knees, keep the load close to their bodies,and avoid twisting. Often, the most comfortable positions are those that put less stress on your recovering back.  Find a comfortable position to sleep. Use a firm mattress and lie on your side with your knees slightly bent. If you lie on your back, put a pillow under your knees.  Only take over-the-counter or prescription medicines as directed by your caregiver. Over-the-counter medicines to reduce pain and inflammation are often the most helpful.Your caregiver may prescribe muscle relaxant drugs.These medicines help dull your pain so you can more quickly return to your normal activities and healthy exercise.  Put ice on the injured area.  Put ice in a plastic bag.  Place a towel between your skin and the bag.  Leave the ice on for 15-20 minutes, 03-04 times a day for the first 2 to 3 days. After that, ice and heat may be alternated to reduce pain and spasms.  Ask your caregiver about trying back exercises and gentle massage. This may be of some benefit.  Avoid feeling anxious or stressed.Stress increases muscle tension and can worsen back pain.It is important to recognize when you are anxious or stressed and learn ways to manage it.Exercise is a great option. SEEK MEDICAL CARE IF:  You have pain that is not relieved with rest or  medicine.  You have pain that does not improve in 1 week.  You have new symptoms.  You are generally not feeling well. SEEK IMMEDIATE MEDICAL CARE IF:   You have pain that radiates from your back into your legs.  You develop new bowel or bladder control problems.  You have unusual weakness or numbness in your arms or legs.  You develop nausea or vomiting.  You develop abdominal pain.  You feel faint. Document Released: 04/04/2005 Document Revised: 10/04/2011 Document Reviewed: 08/06/2013 [facility] Patient Information 2015 [place], [place]. This information is not intended to replace advice given to you by your health care provider. Make sure you discuss any questions you have with your health care provider.

## 2014-02-22 NOTE — ED Notes (Signed)
Pt states R sided flank pain radiating around to front of abdomen. 1/10 pain upon arrival. No nausea/vomiting/diarrhea. Last BM yesterday was normal. Denies urinary symptoms. Pt is alert and oriented x4.

## 2014-03-05 ENCOUNTER — Encounter (HOSPITAL_COMMUNITY): Payer: Self-pay | Admitting: Emergency Medicine

## 2014-03-05 ENCOUNTER — Emergency Department (HOSPITAL_COMMUNITY)
Admission: EM | Admit: 2014-03-05 | Discharge: 2014-03-05 | Disposition: A | Discharge status: Home / Self Care | Payer: BC Managed Care – PPO | Attending: Emergency Medicine | Admitting: Emergency Medicine

## 2014-03-05 ENCOUNTER — Emergency Department (HOSPITAL_COMMUNITY): Payer: BC Managed Care – PPO

## 2014-03-05 DIAGNOSIS — I10 Essential (primary) hypertension: Secondary | ICD-10-CM

## 2014-03-05 DIAGNOSIS — Z79899 Other long term (current) drug therapy: Secondary | ICD-10-CM | POA: Insufficient documentation

## 2014-03-05 DIAGNOSIS — E119 Type 2 diabetes mellitus without complications: Secondary | ICD-10-CM | POA: Insufficient documentation

## 2014-03-05 DIAGNOSIS — R109 Unspecified abdominal pain: Secondary | ICD-10-CM | POA: Diagnosis not present

## 2014-03-05 DIAGNOSIS — R52 Pain, unspecified: Secondary | ICD-10-CM

## 2014-03-05 LAB — URINALYSIS, ROUTINE W REFLEX MICROSCOPIC
Bilirubin Urine: NEGATIVE
HGB URINE DIPSTICK: NEGATIVE
Ketones, ur: NEGATIVE mg/dL
LEUKOCYTES UA: NEGATIVE
NITRITE: NEGATIVE
PROTEIN: NEGATIVE mg/dL
Specific Gravity, Urine: 1.01 (ref 1.005–1.030)
UROBILINOGEN UA: 0.2 mg/dL (ref 0.0–1.0)
pH: 6.5 (ref 5.0–8.0)

## 2014-03-05 LAB — CBC WITH DIFFERENTIAL/PLATELET
BASOS ABS: 0 10*3/uL (ref 0.0–0.1)
BASOS PCT: 1 % (ref 0–1)
Eosinophils Absolute: 0.2 10*3/uL (ref 0.0–0.7)
Eosinophils Relative: 5 % (ref 0–5)
HCT: 40.4 % (ref 39.0–52.0)
Hemoglobin: 14.2 g/dL (ref 13.0–17.0)
Lymphocytes Relative: 48 % — ABNORMAL HIGH (ref 12–46)
Lymphs Abs: 1.7 10*3/uL (ref 0.7–4.0)
MCH: 26.6 pg (ref 26.0–34.0)
MCHC: 35.1 g/dL (ref 30.0–36.0)
MCV: 75.8 fL — ABNORMAL LOW (ref 78.0–100.0)
Monocytes Absolute: 0.3 10*3/uL (ref 0.1–1.0)
Monocytes Relative: 8 % (ref 3–12)
NEUTROS PCT: 38 % — AB (ref 43–77)
Neutro Abs: 1.4 10*3/uL — ABNORMAL LOW (ref 1.7–7.7)
PLATELETS: 204 10*3/uL (ref 150–400)
RBC: 5.33 MIL/uL (ref 4.22–5.81)
RDW: 12.2 % (ref 11.5–15.5)
WBC: 3.6 10*3/uL — ABNORMAL LOW (ref 4.0–10.5)

## 2014-03-05 LAB — COMPREHENSIVE METABOLIC PANEL
ALK PHOS: 69 U/L (ref 39–117)
ALT: 16 U/L (ref 0–53)
ANION GAP: 12 (ref 5–15)
AST: 17 U/L (ref 0–37)
Albumin: 3.5 g/dL (ref 3.5–5.2)
BILIRUBIN TOTAL: 0.8 mg/dL (ref 0.3–1.2)
BUN: 12 mg/dL (ref 6–23)
CHLORIDE: 95 meq/L — AB (ref 96–112)
CO2: 26 meq/L (ref 19–32)
Calcium: 9.2 mg/dL (ref 8.4–10.5)
Creatinine, Ser: 0.94 mg/dL (ref 0.50–1.35)
Glucose, Bld: 312 mg/dL — ABNORMAL HIGH (ref 70–99)
POTASSIUM: 4.5 meq/L (ref 3.7–5.3)
Sodium: 133 mEq/L — ABNORMAL LOW (ref 137–147)
Total Protein: 7.1 g/dL (ref 6.0–8.3)

## 2014-03-05 LAB — URINE MICROSCOPIC-ADD ON

## 2014-03-05 LAB — D-DIMER, QUANTITATIVE (NOT AT ARMC): D DIMER QUANT: 0.29 ug{FEU}/mL (ref 0.00–0.48)

## 2014-03-05 LAB — CBG MONITORING, ED: Glucose-Capillary: 185 mg/dL — ABNORMAL HIGH (ref 70–99)

## 2014-03-05 MED ORDER — HYDROCHLOROTHIAZIDE 25 MG PO TABS
25.0000 mg | ORAL_TABLET | Freq: Every day | ORAL | Status: DC
Start: 2014-03-05 — End: 2014-03-05

## 2014-03-05 MED ORDER — KETOROLAC TROMETHAMINE 60 MG/2ML IM SOLN
60.0000 mg | Freq: Once | INTRAMUSCULAR | Status: DC
  Filled 2014-03-05: qty 2

## 2014-03-05 MED ORDER — METHOCARBAMOL 500 MG PO TABS: 500.0000 mg | ORAL_TABLET | Freq: Four times a day (QID) | ORAL | Status: DC | PRN

## 2014-03-05 MED ORDER — SODIUM CHLORIDE 0.9 % IV BOLUS (SEPSIS)
1000.0000 mL | Freq: Once | INTRAVENOUS | Status: AC
  Administered 2014-03-05: 1000 mL via INTRAVENOUS

## 2014-03-05 MED ORDER — LISINOPRIL 10 MG PO TABS
10.0000 mg | ORAL_TABLET | Freq: Once | ORAL | Status: AC
  Administered 2014-03-05: 10 mg via ORAL
  Filled 2014-03-05: qty 1

## 2014-03-05 MED ORDER — LISINOPRIL 10 MG PO TABS: 10.0000 mg | ORAL_TABLET | Freq: Every day | ORAL | Status: DC

## 2014-03-05 MED ORDER — ACETAMINOPHEN 500 MG PO TABS
1000.0000 mg | ORAL_TABLET | Freq: Once | ORAL | Status: AC
  Administered 2014-03-05: 1000 mg via ORAL
  Filled 2014-03-05: qty 2

## 2014-03-05 NOTE — ED Notes (Addendum)
Rt flank pain steady since the 6th was seen here and tx for muscle stain and given meds but they have not helped last bm this am  Denies dysuria states they did not do xrays last time

## 2014-03-05 NOTE — ED Notes (Signed)
Pt would like to have a pill versus a shot-- will ask PA

## 2014-03-05 NOTE — ED Notes (Signed)
Notified PA and RN of CBG [id number]

## 2014-03-05 NOTE — ED Provider Notes (Signed)
CSN: [id number]     Arrival date & time 03/05/14  0815 History   First MD Initiated Contact with Patient 03/05/14 [phone]     Chief Complaint  Patient presents with  . Flank Pain     (Consider location/radiation/quality/duration/timing/severity/associated sxs/prior Treatment) The history is provided by the patient and medical records.     Pt with hx DM, HTM, p/w persistent right flank, right side pain that has been ongoing since Nov 7.  Was seen Nov 6 for hyperglycemia, Nov 7 for this exact pain.  States the pain is 1/10 intensity, described as constant pressure, occasionally radiates to his back. It is worse with movement and "stiffens up" after staying still for a long time.  Has taken motrin and flexeril without improvement.  The pain improves with lying on his side at night, worse when up and moving and working.  Would like it to go away so he can work out again.  Denies fevers, CP, SOB, cough, N/V, urinary symptoms, change in bowel habits.  Denies recent immobilization or leg swelling.  No personal hx blood clots.  Mother did have DVT.  Hx abdominal surgery, umbilical hernia repair only.   Past Medical History  Diagnosis Date  . Hypertension   . Diabetes mellitus without complication    History reviewed. No pertinent past surgical history. No family history on file. History  Substance Use Topics  . Smoking status: Never Smoker   . Smokeless tobacco: Not on file  . Alcohol Use: No    Review of Systems  All other systems reviewed and are negative.     Allergies  Review of patient's allergies indicates no known allergies.  Home Medications   Prior to Admission medications   Medication Sig Start Date End Date Taking? Authorizing Provider  hydrochlorothiazide (MICROZIDE) 12.5 MG capsule Take 1 capsule (12.5 mg total) by mouth daily. 02/21/14  Yes Heather Laisure, PA-C  metFORMIN (GLUCOPHAGE) 500 MG tablet Take 1 tablet (500 mg total) by mouth 2 (two) times daily with a meal.  02/21/14  Yes Heather Laisure, PA-C  Multiple Vitamins-Minerals (MULTIVITAMIN WITH MINERALS) tablet Take 1 tablet by mouth daily.   Yes Historical Provider, MD   There were no vitals taken for this visit. Physical Exam  Constitutional: He appears well-developed and well-nourished. No distress.  HENT:  Head: Normocephalic and atraumatic.  Eyes: Conjunctivae are normal.  Neck: Normal range of motion. Neck supple.  Cardiovascular: Normal rate and regular rhythm.   Pulmonary/Chest: Effort normal and breath sounds normal. No respiratory distress. He has no wheezes. He has no rales.  Abdominal: Soft. He exhibits no distension and no mass. There is no tenderness. There is no rebound and no guarding.  Musculoskeletal: He exhibits no edema or tenderness.  Spine nontender, no crepitus, or stepoffs.   Neurological: He is alert. He exhibits normal muscle tone.  Skin: No rash noted. He is not diaphoretic.  Psychiatric: He has a normal mood and affect. His behavior is normal.  Nursing note and vitals reviewed.   ED Course  Procedures (including critical care time) Labs Review Labs Reviewed  CBC WITH DIFFERENTIAL - Abnormal; Notable for the following:    WBC 3.6 (*)    MCV 75.8 (*)    Neutrophils Relative % 38 (*)    Neutro Abs 1.4 (*)    Lymphocytes Relative 48 (*)    All other components within normal limits  COMPREHENSIVE METABOLIC PANEL - Abnormal; Notable for the following:    Sodium 133 (*)  Chloride 95 (*)    Glucose, Bld 312 (*)    All other components within normal limits  URINALYSIS, ROUTINE W REFLEX MICROSCOPIC - Abnormal; Notable for the following:    Glucose, UA >1000 (*)    All other components within normal limits  CBG MONITORING, ED - Abnormal; Notable for the following:    Glucose-Capillary 185 (*)    All other components within normal limits  D-DIMER, QUANTITATIVE  URINE MICROSCOPIC-ADD ON    Imaging Review Dg Chest 2 View  03/05/2014   CLINICAL DATA:  Pain,  chest tightness  EXAM: CHEST  2 VIEW  COMPARISON:  None.  FINDINGS: The heart size and mediastinal contours are within normal limits. Both lungs are clear. The visualized skeletal structures are unremarkable.  IMPRESSION: No active cardiopulmonary disease.   Electronically Signed   By: Elige KoHetal  Patel   On: 03/05/2014 12:03   Dg Abd 1 View  03/05/2014   CLINICAL DATA:  Pain on right side.  EXAM: ABDOMEN - 1 VIEW  COMPARISON:  None.  FINDINGS: Rotatory scoliosis of the spine. No dilated loops large or small bowel. No pathologic calcifications. No organomegaly.  IMPRESSION: No acute abdominal findings by radiograph.   Electronically Signed   By: Genevive BiStewart  Edmunds M.D.   On: 03/05/2014 12:04     EKG Interpretation None       MDM   Final diagnoses:  Pain  Right flank pain  Essential hypertension    Afebrile, nontoxic patient with right flank/right side pain with no other associated symptoms.  Appears to be musculoskeletal.  Has been treated with motrin and flexeril without relief.  Pain is 1/10, never more severe.  He is also hypertensive, is attempting to follow up with PCP (will need to establish PCP).  Is only on 12.5 HCTZ prescribed by ED.  Will add lisinopril given his diabetes.  First dose given here. No CP, SOB, HA, AMS.  Renal function normal.  Doubt hypertensive emergency.   D-dimer negative, CXR negative, KUB negative for stone.  UA shows glucosuria, blood tests demonstrated hyperglycemia and electrolyte changes consistent with this.   Anion gap is 12.   CBG improved with IVF.  D/C home with robaxin, lisinopril, PCP resources for follow up.  Pt instructed to follow up with PCP for blood pressure recheck within one week.  Discussed result, findings, treatment, and follow up  with patient.  Pt given return precautions.  Pt verbalizes understanding and agrees with plan.         Trixie Dredgemily Cheree Fowles, PA-C 03/05/14 1425  Glynn OctaveStephen Rancour, MD 03/05/14 1556

## 2014-03-05 NOTE — Discharge Instructions (Signed)
Read the information below.  Use the prescribed medication as directed.  Please discuss all new medications with your pharmacist.  You may return to the Emergency Department at any time for worsening condition or any new symptoms that concern you.   If you develop worsening pain, shortness of breath, fever, you pass out, or become weak or dizzy, return to the ER for a recheck.      Emergency Department Resource Guide 1) Find a Doctor and Pay Out of Pocket Although you won't have to find out who is covered by your insurance plan, it is a good idea to ask around and get recommendations. You will then need to call the office and see if the doctor you have chosen will accept you as a new patient and what types of options they offer for patients who are self-pay. Some doctors offer discounts or will set up payment plans for their patients who do not have insurance, but you will need to ask so you aren't surprised when you get to your appointment.  2) Contact Your Local Health Department Not all health departments have doctors that can see patients for sick visits, but many do, so it is worth a call to see if yours does. If you don't know where your local health department is, you can check in your phone book. The CDC also has a tool to help you locate your state's health department, and many state websites also have listings of all of their local health departments.  3) Find a Walk-in Clinic If your illness is not likely to be very severe or complicated, you may want to try a walk in clinic. These are popping up all over the country in pharmacies, drugstores, and shopping centers. They're usually staffed by nurse practitioners or physician assistants that have been trained to treat common illnesses and complaints. They're usually fairly quick and inexpensive. However, if you have serious medical issues or chronic medical problems, these are probably not your best option.  No Primary Care Doctor: - Call  Health Connect at  [phone] - they can help you locate a primary care doctor that  accepts your insurance, provides certain services, etc. - Physician Referral Service- [phone]  Chronic Pain Problems: Organization         Address  Phone   Notes  [facility]  [phone]) [phone] Patients need to be referred by their primary care doctor.   Medication Assistance: Organization         Address  Phone   Notes  [facility] [address] [place], [place] [address] [phone]) [phone] --Must be a resident of [facility] -- Must have NO insurance coverage whatsoever (no Medicaid/ Medicare, etc.) -- The pt. MUST have a primary care doctor that directs their care regularly and follows them in the community   MedAssist  [phone]   [employer]  [phone]    Agencies that provide inexpensive medical care: Organization         Address  Phone   Notes  [facility]  [phone]) [phone]   [facility]    [phone]   [facility] [address] [place], [place] [address] [phone]) [phone]   [facility] [address], [place] [phone]) [phone]   [facility]anned Parenthood    [phone]   [facility]    [phone]) [phone]   [facility]  [address], [place] Phone:  [phone], Fax:  [phone] Hours of Operation:  9 am - 6 pm, M-F.  Also accepts Medicaid/Medicare and self-pay.  [facility]  [address], [place] Phone: [phone], Fax: [phone]. Hours of Operation:  8:30 am - 5:30 pm, M-F.  Also accepts Medicaid and self-pay.  [facility] [place] [facility] [place] [address], [place] Phone: [phone]   [facility] [address] Natasha Bence [place], [place] [phone], Ext. [phone] Mondays & Thursdays: 7-9 AM.  First 15 patients are seen on a first come, first serve  basis.    Medicaid-accepting [facility] Providers:  Organization         Address  Phone   Notes  [facility] [address], [place] [phone] Also accepts self-pay patients.  [facility] [address] Laurell Josephs [place], [place]  [phone]   [facility] [address], [place] [phone]   [facility] [address], [place] [phone]   Renaye Rakers [address], [place]   [phone] Only accepts [place] patients after they have their name applied to their card.   Self-Pay (no insurance) in [facility]:  Organization         Address  Phone   Notes  Sickle Cell Patients, [facility] [address] [place], [place] [phone]   [facility] [address] [place], [place] [phone]   [facility] [place]  [address], [place] [phone]   [facility]/Dr. Osei-Bonsu  [address], [place] or [address], [place] [phone] Phone number for both [place] and [place] locations is the same.  [facility] [address] [place] [phone]   [facility] [place] [address], [place] or [address] [phone]   [facility] [address], [place] [phone], phone; [phone], fax Sees patients 1st and 3rd Saturday of every month.  Must not qualify for public or private insurance (i.e. Medicaid, Medicare, [place] Health Choice, Veterans' Benefits)  Household income should be no more than 200% of the poverty level The clinic cannot treat you if you are pregnant or think you are pregnant  Sexually transmitted diseases are not treated at the clinic.    Dental Care: Organization         Address  Phone  Notes  [facility] [address] [place], [place] [phone] Accepts children up to age 41 who are enrolled in [place] or [place] Health Choice; pregnant women with a Medicaid card; and children who have applied for Medicaid or [place] Health Choice, but were declined, whose parents can pay a reduced fee at time of service.  [facility]  [address], [place] [phone] Accepts children up to age 15 who are enrolled in [place] or [place] Health Choice; pregnant women with a Medicaid card; and children who have applied for Medicaid or [place] Health Choice, but were declined, whose parents can pay a reduced fee at time of service.  [facility]  [address] [place], [place] [phone]  [phone] Patients are seen by appointment only. Walk-ins are not accepted. [facility] will see patients 32 years of age and older. Monday - Tuesday (8am-5pm) Most Wednesdays (8:30-5pm) $30 per visit, cash only  [facility] Adult [facility] Access PRO[facility]  [address], [facility] [phone] Patients are seen by appointment only. Walk-ins are not accepted. [facility] will see patients 64 years of age and older. One Wednesday Evening (Monthly: Volunteer Based).  $30 per visit, cash only  [employer]  [phone] for adults; Children under age 66, call Graduate Pediatric Dentistry at [phone]. Children aged 57-14, please call [phone] to request a pediatric application.  Dental services are provided in all areas of dental care including fillings, crowns and bridges, complete and partial dentures, implants, gum treatment, root canals, and extractions. Preventive care is also provided. Treatment is provided to both adults and children. Patients are selected via a lottery and there is often a waiting list.   [facility] [address] [place]  [phone]  [email or web address].[email or web address]   [facility] [address] [place], [place] [phone], Ext. [phone] Second and Fourth Thursday of each month, opens at 6:30 AM; Clinic ends at 9 AM.  Patients are seen on a first-come first-served basis, and a limited number are seen during each clinic.   [facility]  [address] Ether Griffins [place], [place] [phone]   Eligibility Requirements You must have lived in [place], [place], or [place] counties for at least the last three months.   You cannot be eligible for state or federal sponsored [place], including [employer], [place], or [employer].   You generally cannot be eligible for healthcare insurance through your employer.    How to apply: Eligibility screenings are held every Tuesday and Wednesday afternoon from 1:00 pm until 4:00 pm. You do not need an appointment for the interview!  [facility] [address], [place], [place] [phone]   [facility] Health Department  [phone]   [facility] Health Department  [phone]   [facility]  [phone]    Behavioral Health Resources in the Community: Intensive Outpatient Programs Organization         Address  Phone  Notes  [facility] [address], [place], [place] [phone]   [facility]patient [address], [place], [place] [phone]   [facility] [address], [place], [place]  [phone]   [facility] [address]  [place], [place] [phone] or [phone]   Substance Abuse Resources Organization         Address  Phone  Notes  [facility]  [phone]   [facility]  [phone]   [facility] [place]  [phone]   Floydene Flock  [phone]   Residential & Outpatient Substance Abuse Program  [phone]   Psychological Services Organization          Address  Phone  Notes  [facility]  [phone]   [facility]  [phone]   [facility] Mental Health [address], [place] [phone] or [phone]    Mobile Crisis Teams Organization         Address  Phone  Notes  [facility], Mobile Crisis Care Unit  [phone]   [facility]  [address] [place], [place] [phone]   Doristine Locks [address] [place] [phone]    Self-Help/Support Groups Organization         Address  Phone             Notes  Mental Health Assoc. of  - variety of support groups  [phone]- [phone] Call for more information  Narcotics Anonymous (NA), [facility]aring [facility] [address], [place]  2 meetings at this location   Statisticianesidential Treatment Programs Organization         Address  Phone  Notes  [facility] [address],    [place]  [phone]   [facility]  [address], [place] [phone], [place], [place] [phone]   [facility] [address], [place] [phone] Admissions: 8am-3pm M-F  [facility] [address]    [place], [place] [phone]   [facility] [address], [place], [place] [phone]   [facility] [address]  [place], [place] [phone]   [facility] - Intensive Outpatient [address], [place], [place] [phone]   [facility] ([facility]iction Recovery Care [facility] [address]  [place], [place] [phone] or [phone]   [facility] ([facility]) [address] [place], [place] [phone] Accepts Medicaid  [facility] [address]  [place] [phone] Substance Abuse/Addiction Treatment   [facility] Behavioral Health Resources Organization         Address  Phone  Notes  [facility]  [phone](888) [phone]   Angie FavaJulie Brannon, PhD [address], [address] [place], [place]   [phone]) [phone] or [phone]) [phone]   [facility]   [address] [place], [place] [phone](336) [phone]   [facility] [address], [place], [place] [phone](336) [phone] Insurance/Medicaid/sponsorship through [facility]  [facility] [address]                                    [place], [place] [phone](336) [phone] Therapy/tele-psych/case  [facility] [address]   [place], [place] [phone]) [phone]    Dr. Lolly MustacheArfeen  [phone](336) [phone]   [facility] [place]  [employer] [facility] 1) [address], [place] 2) [address], [place] 3)  [address], [place] [phone]) [phone]) [phone]  [phone]) [phone]   [facility] Child Abuse Hotline [phone](336) [phone] or [phone]) [phone] (After Hours)

## 2014-03-12 DIAGNOSIS — M549 Dorsalgia, unspecified: Secondary | ICD-10-CM | POA: Insufficient documentation

## 2014-03-12 DIAGNOSIS — E1159 Type 2 diabetes mellitus with other circulatory complications: Secondary | ICD-10-CM | POA: Insufficient documentation

## 2014-03-12 DIAGNOSIS — Z8249 Family history of ischemic heart disease and other diseases of the circulatory system: Secondary | ICD-10-CM | POA: Insufficient documentation

## 2015-07-30 IMAGING — CR DG ABDOMEN 1V
2 series · 2 of 2 positions shown · non-contrast
Comparison: None.

CLINICAL DATA: Pain on right side.

EXAM:
ABDOMEN - 1 VIEW

[t abdomen supine (1 of 2)]
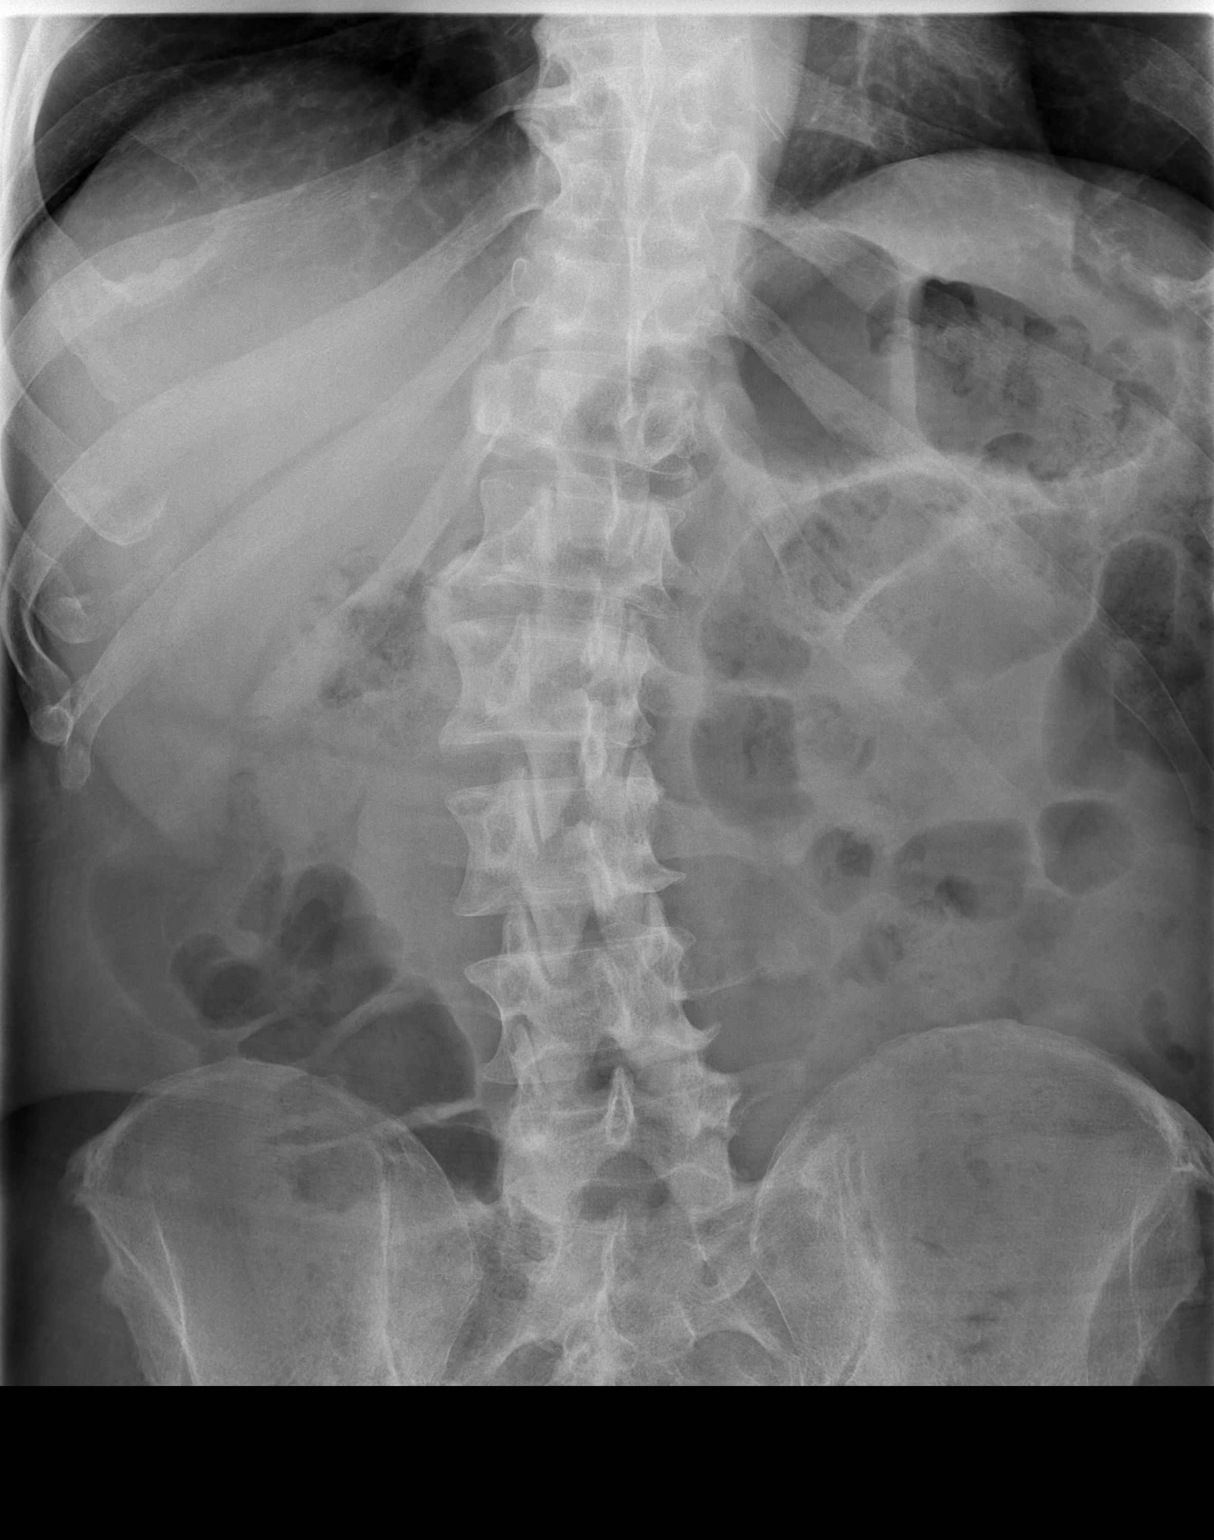

[t abdomen supine (2 of 2)]
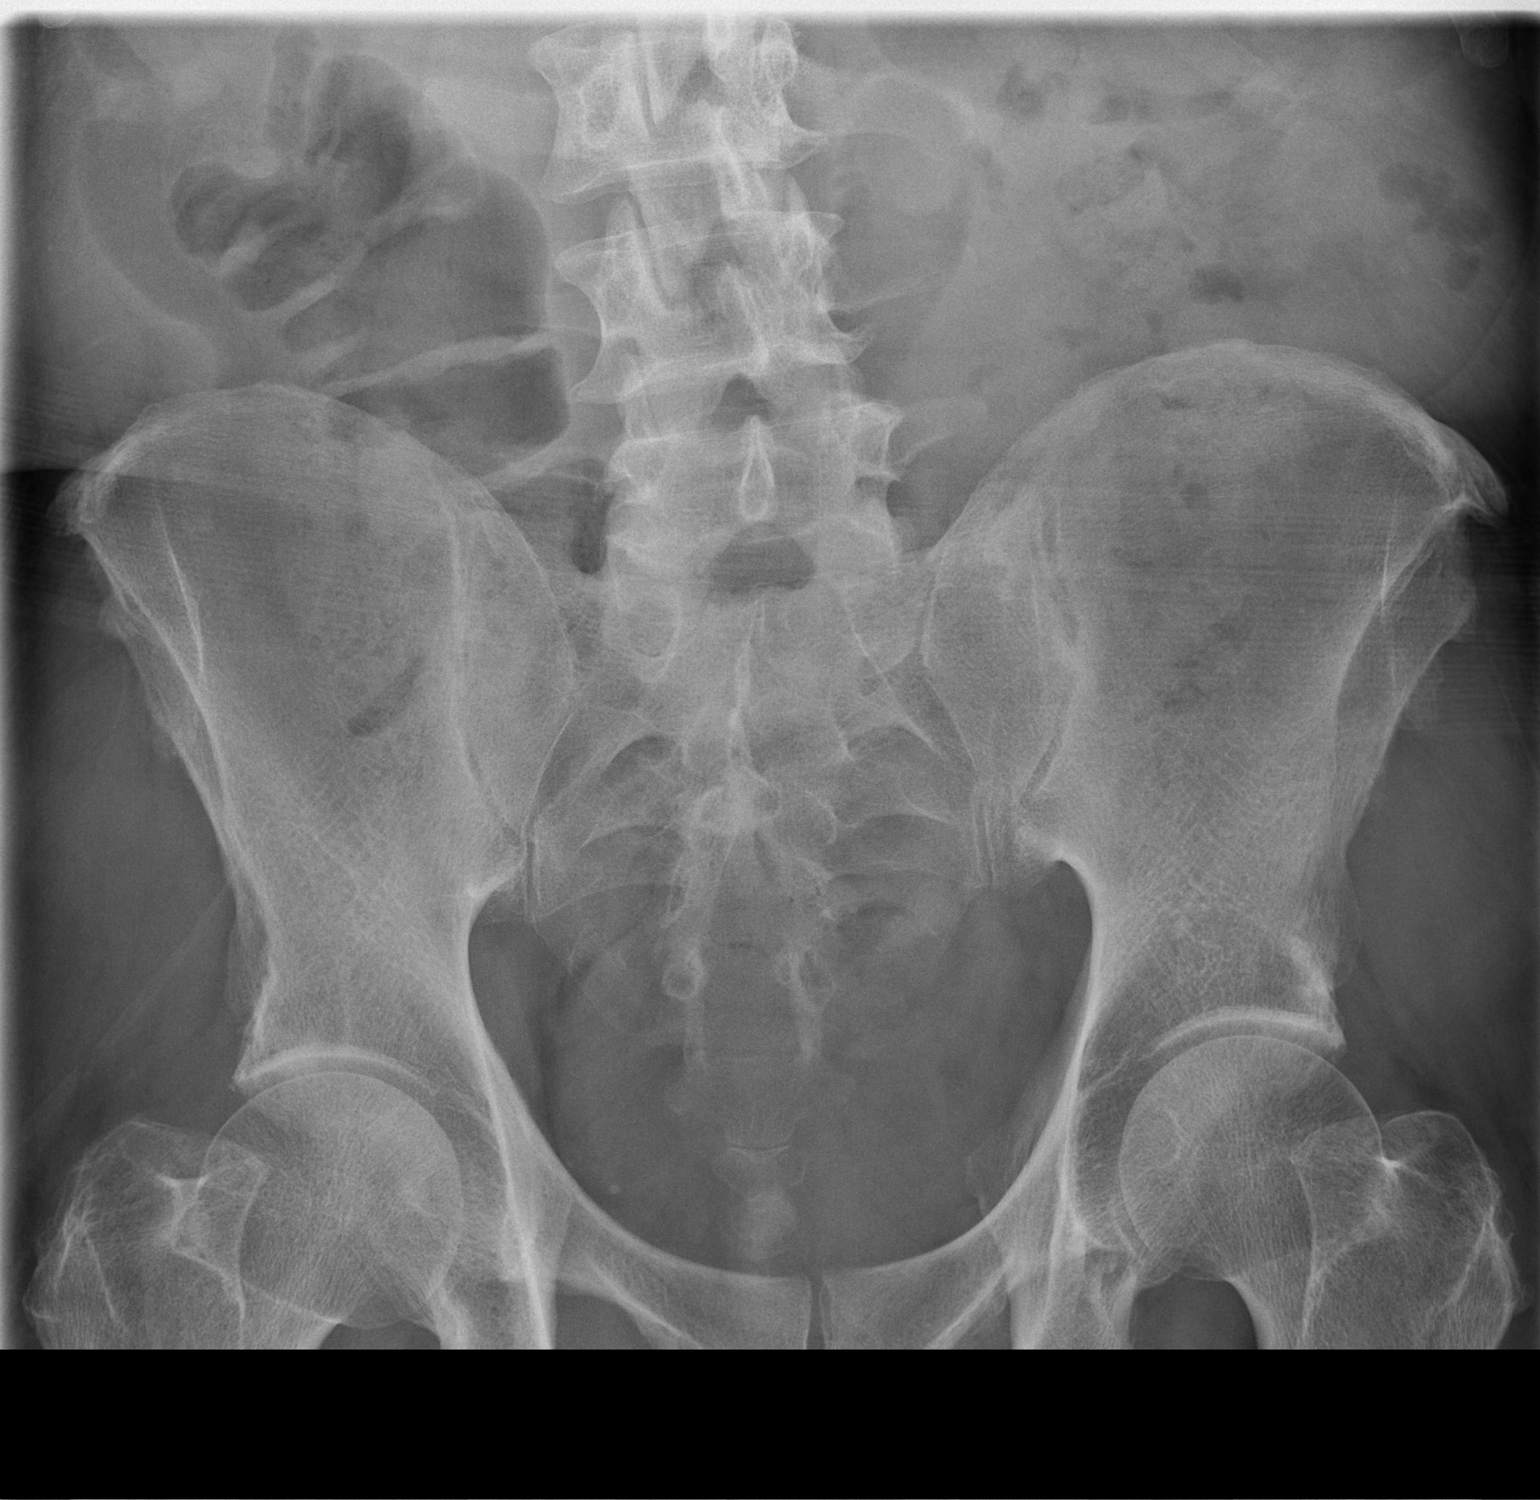

[2 of 2 positions shown; findings below may reference images not displayed]

FINDINGS: Rotatory scoliosis of the spine. No dilated loops large or small
bowel. No pathologic calcifications. No organomegaly.
IMPRESSION: No acute abdominal findings by radiograph.

## 2016-08-18 DIAGNOSIS — Z794 Long term (current) use of insulin: Secondary | ICD-10-CM | POA: Insufficient documentation

## 2016-08-18 DIAGNOSIS — E1159 Type 2 diabetes mellitus with other circulatory complications: Secondary | ICD-10-CM | POA: Insufficient documentation

## 2019-05-20 ENCOUNTER — Encounter (HOSPITAL_COMMUNITY): Payer: Self-pay

## 2019-05-20 ENCOUNTER — Ambulatory Visit (HOSPITAL_COMMUNITY)
Admission: EM | Admit: 2019-05-20 | Discharge: 2019-05-20 | Disposition: A | Discharge status: Home / Self Care | Payer: BC Managed Care – PPO | Attending: Family Medicine | Admitting: Family Medicine

## 2019-05-20 ENCOUNTER — Other Ambulatory Visit: Payer: Self-pay

## 2019-05-20 DIAGNOSIS — R05 Cough: Secondary | ICD-10-CM | POA: Diagnosis present

## 2019-05-20 DIAGNOSIS — Z7901 Long term (current) use of anticoagulants: Secondary | ICD-10-CM | POA: Diagnosis not present

## 2019-05-20 DIAGNOSIS — Z20822 Contact with and (suspected) exposure to covid-19: Secondary | ICD-10-CM | POA: Insufficient documentation

## 2019-05-20 DIAGNOSIS — R6883 Chills (without fever): Secondary | ICD-10-CM | POA: Diagnosis not present

## 2019-05-20 DIAGNOSIS — R5383 Other fatigue: Secondary | ICD-10-CM | POA: Diagnosis not present

## 2019-05-20 DIAGNOSIS — I10 Essential (primary) hypertension: Secondary | ICD-10-CM | POA: Insufficient documentation

## 2019-05-20 DIAGNOSIS — Z7984 Long term (current) use of oral hypoglycemic drugs: Secondary | ICD-10-CM | POA: Diagnosis not present

## 2019-05-20 DIAGNOSIS — R682 Dry mouth, unspecified: Secondary | ICD-10-CM | POA: Diagnosis not present

## 2019-05-20 DIAGNOSIS — R0981 Nasal congestion: Secondary | ICD-10-CM | POA: Insufficient documentation

## 2019-05-20 DIAGNOSIS — E119 Type 2 diabetes mellitus without complications: Secondary | ICD-10-CM | POA: Diagnosis not present

## 2019-05-20 DIAGNOSIS — R059 Cough, unspecified: Secondary | ICD-10-CM

## 2019-05-20 LAB — GLUCOSE, CAPILLARY: Glucose-Capillary: 460 mg/dL — ABNORMAL HIGH (ref 70–99)

## 2019-05-20 LAB — CBG MONITORING, ED: Glucose-Capillary: 460 mg/dL — ABNORMAL HIGH (ref 70–99)

## 2019-05-20 MED ORDER — AMLODIPINE BESYLATE 5 MG PO TABS: 5.0000 mg | ORAL_TABLET | Freq: Every day | ORAL | 0 refills | Status: DC

## 2019-05-20 MED ORDER — LISINOPRIL 10 MG PO TABS: 10.0000 mg | ORAL_TABLET | Freq: Every day | ORAL | 0 refills | Status: DC

## 2019-05-20 MED ORDER — METFORMIN HCL 500 MG PO TABS: 500.0000 mg | ORAL_TABLET | Freq: Two times a day (BID) | ORAL | 0 refills | Status: DC

## 2019-05-20 NOTE — ED Triage Notes (Signed)
Pt c/o cough, fatigue, dry mouth, nasal congestion, chills x1 week. Denies abd pain, n/v/d, body aches, HA, sore throat.   States drinks lot of water as his normal baseline.  States quit taking HTN and DM meds of own accord.  Last time pt checked CBG it was >200 (approx 2 days ago).

## 2019-05-20 NOTE — ED Provider Notes (Addendum)
[facility]    CSN: [id number] Arrival date & time: 05/20/19  0907      History   Chief Complaint Chief Complaint  Patient presents with  . Cough  . Fatigue    HPI Ernest [place] is a 59 y.o. male.   Pt is a 59 year old male with PMH of diabetes and HTN that presents with approximately one week or more of fatigue, cough, dry mouth, increased thirst, nasal congestion, chills.  Symptoms have been constant, waxing waning.  He has been taking Mucinex for his symptoms.  Denies any associate abdominal pain, nausea, vomiting, diarrhea, body aches, headache or sore throat.  No recorded fevers at home.  Patient reports he has not been on his diabetes or blood pressure medication for over a year now.  Took himself off due to preference.  Checks his blood sugar randomly and the last one he checked was greater than 200.  This was 2 days ago.  No headaches, weakness, dizziness, blurred vision, chest pain or shortness of breath.  ROS per HPI    Cough   Past Medical History:  Diagnosis Date  . Diabetes mellitus without complication (HCC)   . Hypertension     There are no problems to display for this patient.   History reviewed. No pertinent surgical history.     Home Medications    Prior to Admission medications   Medication Sig Start Date End Date Taking? Authorizing Provider  amLODipine (NORVASC) 5 MG tablet Take 1 tablet (5 mg total) by mouth daily. 05/20/19   Dahlia Byes A, NP  metFORMIN (GLUCOPHAGE) 500 MG tablet Take 1 tablet (500 mg total) by mouth 2 (two) times daily with a meal. 05/20/19 06/19/19  Mende Biswell, Gloris Manchester A, NP  hydrochlorothiazide (MICROZIDE) 12.5 MG capsule Take 1 capsule (12.5 mg total) by mouth daily. 02/21/14 05/20/19  [name], PA-C  lisinopril (ZESTRIL) 10 MG tablet Take 1 tablet (10 mg total) by mouth daily. 05/20/19 05/20/19  [name], NP    Family History Family History  Problem Relation Age of Onset  . Hypertension Mother   . Diabetes  Mother   . Diabetes Father   . Hypertension Father     Social History Social History   Tobacco Use  . Smoking status: Never Smoker  . Smokeless tobacco: Never Used  Substance Use Topics  . Alcohol use: No  . Drug use: No     Allergies   Patient has no known allergies.   Review of Systems Review of Systems  Respiratory: Positive for cough.      Physical Exam Triage Vital Signs ED Triage Vitals  Enc Vitals Group     BP 05/20/19 0947 (!) 157/115     Pulse Rate 05/20/19 0947 96     Resp 05/20/19 0947 18     Temp 05/20/19 0947 98.2 F (36.8 C)     Temp Source 05/20/19 0947 Oral     SpO2 05/20/19 0947 100 %     Weight --      Height --      Head Circumference --      Peak Flow --      Pain Score 05/20/19 0944 0     Pain Loc --      Pain Edu? --      Excl. in GC? --    No data found.  Updated Vital Signs BP (!) 157/115 (BP Location: Left Arm) Comment: provider notified  Pulse 96  Temp 98.2 F (36.8 C) (Oral)   Resp 18   SpO2 100%   Visual Acuity Right Eye Distance:   Left Eye Distance:   Bilateral Distance:    Right Eye Near:   Left Eye Near:    Bilateral Near:     Physical Exam Vitals and nursing note reviewed.  Constitutional:      General: He is not in acute distress.    Appearance: Normal appearance. He is not ill-appearing, toxic-appearing or diaphoretic.  HENT:     Head: Normocephalic and atraumatic.     Nose: Nose normal.  Eyes:     Conjunctiva/sclera: Conjunctivae normal.  Cardiovascular:     Rate and Rhythm: Normal rate and regular rhythm.  Pulmonary:     Effort: Pulmonary effort is normal.     Breath sounds: Normal breath sounds.  Musculoskeletal:        General: Normal range of motion.     Cervical back: Normal range of motion.  Skin:    General: Skin is warm and dry.  Neurological:     General: No focal deficit present.     Mental Status: He is alert.  Psychiatric:        Mood and Affect: Mood normal.      UC  Treatments / Results  Labs (all labs ordered are listed, but only abnormal results are displayed) Labs Reviewed  GLUCOSE, CAPILLARY - Abnormal; Notable for the following components:      Result Value   Glucose-Capillary 460 (*)    All other components within normal limits  CBG MONITORING, ED - Abnormal; Notable for the following components:   Glucose-Capillary 460 (*)    All other components within normal limits  NOVEL CORONAVIRUS, NAA (HOSP ORDER, SEND-OUT TO REF LAB; TAT 18-24 HRS)    EKG   Radiology No results found.  Procedures Procedures (including critical care time)  Medications Ordered in UC Medications - No data to display  Initial Impression / Assessment and Plan / UC Course  I have reviewed the triage vital signs and the nursing notes.  Pertinent labs & imaging results that were available during my care of the patient were reviewed by me and considered in my medical decision making (see chart for details).     Essential hypertension-started patient on amlodipine.  He was previously taking lisinopril and hydrochlorothiazide. Nonfocal to restarting this medication without blood work.  Patient has refused to have blood work drawn today but is requesting medication refills.  Reporting that he wants to follow-up with primary care for blood work and further care. No concerning signs or symptoms or red flags today.  Diabetes type 2- blood sugar 460 today. Restarted Metformin.  Once again patient refused lab work today so unsure if patient is truly in DKA at this time.  He is nontoxic or ill-appearing and vital signs are normal.  DKA is not likely but is still a possibility. Discussed the risk of not getting lab work today with patient.  Contact given for primary care doctor for follow-up  Lab testing for COVID-19-swab sent for testing labs pending.  Work note given until we receive results. Recommend over-the-counter medications as needed. Follow up as needed for  continued or worsening symptoms  Final Clinical Impressions(s) / UC Diagnoses   Final diagnoses:  Essential hypertension  Type 2 diabetes mellitus without complication, without long-term current use of insulin [facility])     Discharge Instructions     COVID test done today Labs pending.  Stay home and quarantine until we get the results.  I believe your symptoms may be related to your blood sugars being high and not COVID. I can only give you 3 days off here that is our policy.  I am giving you 30 days of medication for your blood pressure and diabetes. You need to have blood work done. Call the primary care listed on your papers.  Drink plenty of fluids and watch your diet.  Follow up as needed for continued or worsening symptoms      ED Prescriptions    Medication Sig Dispense Auth. Provider   metFORMIN (GLUCOPHAGE) 500 MG tablet Take 1 tablet (500 mg total) by mouth 2 (two) times daily with a meal. 60 tablet [place] Winslow A, NP   lisinopril (ZESTRIL) 10 MG tablet  (Status: Discontinued) Take 1 tablet (10 mg total) by mouth daily. 30 tablet Canon Gola A, NP   amLODipine (NORVASC) 5 MG tablet Take 1 tablet (5 mg total) by mouth daily. 30 tablet Dahlia Byes A, NP     PDMP not reviewed this encounter.   [name], NP 05/20/19 1056    Dahlia Byes A, NP 05/20/19 1057

## 2019-05-20 NOTE — Discharge Instructions (Signed)
COVID test done today Labs pending.  Stay home and quarantine until we get the results.  I believe your symptoms may be related to your blood sugars being high and not COVID. I can only give you 3 days off here that is our policy.  I am giving you 30 days of medication for your blood pressure and diabetes. You need to have blood work done. Call the primary care listed on your papers.  Drink plenty of fluids and watch your diet.  Follow up as needed for continued or worsening symptoms

## 2019-05-22 LAB — NOVEL CORONAVIRUS, NAA (HOSP ORDER, SEND-OUT TO REF LAB; TAT 18-24 HRS): SARS-CoV-2, NAA: NOT DETECTED

## 2019-05-23 ENCOUNTER — Telehealth: Payer: Self-pay | Admitting: General Practice

## 2019-05-23 NOTE — Telephone Encounter (Signed)
Negative COVID results given. Patient results "NOT Detected." Caller expressed understanding. ° °

## 2019-08-16 DIAGNOSIS — Z89422 Acquired absence of other left toe(s): Secondary | ICD-10-CM | POA: Insufficient documentation

## 2019-08-16 DIAGNOSIS — M21962 Unspecified acquired deformity of left lower leg: Secondary | ICD-10-CM | POA: Insufficient documentation

## 2019-08-21 DIAGNOSIS — N529 Male erectile dysfunction, unspecified: Secondary | ICD-10-CM | POA: Insufficient documentation

## 2019-08-23 DIAGNOSIS — E1129 Type 2 diabetes mellitus with other diabetic kidney complication: Secondary | ICD-10-CM | POA: Insufficient documentation

## 2020-08-26 DIAGNOSIS — L97509 Non-pressure chronic ulcer of other part of unspecified foot with unspecified severity: Secondary | ICD-10-CM | POA: Insufficient documentation

## 2020-11-16 ENCOUNTER — Other Ambulatory Visit: Payer: Self-pay

## 2020-11-16 ENCOUNTER — Ambulatory Visit
Admission: EM | Admit: 2020-11-16 | Discharge: 2020-11-16 | Disposition: A | Discharge status: Home / Self Care | Payer: BC Managed Care – PPO | Attending: Emergency Medicine | Admitting: Emergency Medicine

## 2020-11-16 DIAGNOSIS — E119 Type 2 diabetes mellitus without complications: Secondary | ICD-10-CM | POA: Diagnosis not present

## 2020-11-16 DIAGNOSIS — I10 Essential (primary) hypertension: Secondary | ICD-10-CM | POA: Diagnosis not present

## 2020-11-16 DIAGNOSIS — Z760 Encounter for issue of repeat prescription: Secondary | ICD-10-CM

## 2020-11-16 DIAGNOSIS — Z794 Long term (current) use of insulin: Secondary | ICD-10-CM | POA: Diagnosis not present

## 2020-11-16 LAB — POCT FASTING CBG KUC MANUAL ENTRY: POCT Glucose (KUC): 194 mg/dL — AB (ref 70–99)

## 2020-11-16 MED ORDER — CHLORTHALIDONE 25 MG PO TABS: 25.0000 mg | ORAL_TABLET | Freq: Every day | ORAL | 0 refills | Status: DC

## 2020-11-16 MED ORDER — INSULIN GLARGINE 100 UNIT/ML ~~LOC~~ SOLN: 40.0000 [IU] | Freq: Every day | SUBCUTANEOUS | 0 refills | Status: DC

## 2020-11-16 MED ORDER — METFORMIN HCL ER (MOD) 1000 MG PO TB24: 2000.0000 mg | ORAL_TABLET | Freq: Every day | ORAL | 0 refills | Status: DC

## 2020-11-16 MED ORDER — AMLODIPINE BESYLATE 5 MG PO TABS: 5.0000 mg | ORAL_TABLET | Freq: Every day | ORAL | 0 refills | Status: DC

## 2020-11-16 NOTE — Discharge Instructions (Addendum)
Please continue to take amlodipine, chlorthalidone, metformin and insulin as prescribed Establish care with new primary care-contact below Follow-up if not improving or worsening

## 2020-11-16 NOTE — ED Triage Notes (Signed)
Pt presents with request to check for blood sugar and blood pressure.  States he does not have  PCP and states he would like a refill on his BP medications.

## 2020-11-17 ENCOUNTER — Telehealth: Payer: Self-pay

## 2020-11-17 ENCOUNTER — Telehealth: Payer: Self-pay | Admitting: Emergency Medicine

## 2020-11-17 MED ORDER — METFORMIN HCL ER 500 MG PO TB24: 2000.0000 mg | ORAL_TABLET | Freq: Every day | ORAL | 2 refills | Status: DC

## 2020-11-17 MED ORDER — INSULIN GLARGINE 100 UNIT/ML SOLOSTAR PEN: 40.0000 [IU] | PEN_INJECTOR | Freq: Every day | SUBCUTANEOUS | 0 refills | Status: DC

## 2020-11-17 NOTE — ED Provider Notes (Signed)
[facility]    CSN: [id number] Arrival date & time: 11/16/20  1546      History   Chief Complaint Chief Complaint  Patient presents with   Hypertension    HPI Ernest Edwards is a 60 y.o. male presenting today for evaluation of hypertension and diabetes.  Patient reports that he missed his recent PCP visit who no longer will see him due to multiple missed visits.  He has been out of his amlodipine, chlorthalidone, insulin and metformin for a couple of days and requesting check of blood pressure-blood sugar.  He denies any symptoms prompting his visit today.  Denies headaches, vision changes, difficulty speaking, chest pain, shortness of breath, nausea vomiting or abdominal pain.  Denies dizziness or lightheadedness.  HPI  Past Medical History:  Diagnosis Date   Diabetes mellitus without complication (HCC)    Hypertension     There are no problems to display for this patient.   History reviewed. No pertinent surgical history.     Home Medications    Prior to Admission medications   Medication Sig Start Date End Date Taking? Authorizing Provider  chlorthalidone (HYGROTON) 25 MG tablet Take 1 tablet (25 mg total) by mouth daily. 11/16/20  Yes [name], PA-C  insulin glargine (LANTUS) 100 UNIT/ML injection Inject 0.4 mLs (40 Units total) into the skin at bedtime. 11/16/20 02/14/21 Yes [name], PA-C  metFORMIN (GLUMETZA) 1000 MG (MOD) 24 hr tablet Take 2 tablets (2,000 mg total) by mouth daily with breakfast. 11/16/20 02/14/21 Yes [name], PA-C  amLODipine (NORVASC) 5 MG tablet Take 1 tablet (5 mg total) by mouth daily. 11/16/20   [name], PA-C  hydrochlorothiazide (MICROZIDE) 12.5 MG capsule Take 1 capsule (12.5 mg total) by mouth daily. 02/21/14 05/20/19  [name], PA-C  lisinopril (ZESTRIL) 10 MG tablet Take 1 tablet (10 mg total) by mouth daily. 05/20/19 05/20/19  [name], NP    Family History Family History  Problem  Relation Age of Onset   Hypertension Mother    Diabetes Mother    Diabetes Father    Hypertension Father     Social History Social History   Tobacco Use   Smoking status: Never   Smokeless tobacco: Never  Vaping Use   Vaping Use: Never used  Substance Use Topics   Alcohol use: No   Drug use: No     Allergies   Patient has no known allergies.   Review of Systems Review of Systems  Constitutional:  Negative for fatigue and fever.  HENT:  Negative for congestion, sinus pressure and sore throat.   Eyes:  Negative for photophobia, pain and visual disturbance.  Respiratory:  Negative for cough and shortness of breath.   Cardiovascular:  Negative for chest pain.  Gastrointestinal:  Negative for abdominal pain, nausea and vomiting.  Genitourinary:  Negative for decreased urine volume and hematuria.  Musculoskeletal:  Negative for myalgias, neck pain and neck stiffness.  Neurological:  Negative for dizziness, syncope, facial asymmetry, speech difficulty, weakness, light-headedness, numbness and headaches.    Physical Exam Triage Vital Signs ED Triage Vitals  Enc Vitals Group     BP 11/16/20 1557 (!) 155/99     Pulse Rate 11/16/20 1555 90     Resp 11/16/20 1555 18     Temp 11/16/20 1555 98 F (36.7 C)     Temp Source 11/16/20 1555 Oral     SpO2 11/16/20 1555 97 %  Weight --      Height --      Head Circumference --      Peak Flow --      Pain Score 11/16/20 1554 0     Pain Loc --      Pain Edu? --      Excl. in GC? --    No data found.  Updated Vital Signs BP (!) 155/99 (BP Location: Right Arm)   Pulse 90   Temp 98 F (36.7 C) (Oral)   Resp 18   SpO2 97%   Visual Acuity Right Eye Distance:   Left Eye Distance:   Bilateral Distance:    Right Eye Near:   Left Eye Near:    Bilateral Near:     Physical Exam Vitals and nursing note reviewed.  Constitutional:      Appearance: He is well-developed.     Comments: No acute distress  HENT:     Head:  Normocephalic and atraumatic.     Nose: Nose normal.  Eyes:     Conjunctiva/sclera: Conjunctivae normal.  Cardiovascular:     Rate and Rhythm: Normal rate and regular rhythm.  Pulmonary:     Effort: Pulmonary effort is normal. No respiratory distress.  Abdominal:     General: There is no distension.  Musculoskeletal:        General: Normal range of motion.     Cervical back: Neck supple.  Skin:    General: Skin is warm and dry.  Neurological:     Mental Status: He is alert and oriented to person, place, and time.     UC Treatments / Results  Labs (all labs ordered are listed, but only abnormal results are displayed) Labs Reviewed  POCT FASTING CBG [facility] MANUAL ENTRY - Abnormal; Notable for the following components:      Result Value   POCT Glucose ([facility]) 194 (*)    All other components within normal limits    EKG   Radiology No results found.  Procedures Procedures (including critical care time)  Medications Ordered in UC Medications - No data to display  Initial Impression / Assessment and Plan / UC Course  I have reviewed the triage vital signs and the nursing notes.  Pertinent labs & imaging results that were available during my care of the patient were reviewed by me and considered in my medical decision making (see chart for details).     Hypertension-blood pressure 151/99 today, on chart review from [facility] primary care patient was previously on amlodipine 5 mg and chlorthalidone 25 mg.  We will refill this Diabetes-refilled metformin continued on dose most recently noted on [facility] visit along with insulin Lantus 40 units at bedtime  Provided contacts to establish care with new PCP and stressed importance of this for further monitoring and management of hypertension/diabetes.  Discussed strict return precautions. Patient verbalized understanding and is agreeable with plan.  Final Clinical Impressions(s) / UC Diagnoses   Final diagnoses:  Essential  hypertension  Type 2 diabetes mellitus without complication, with long-term current use of insulin (HCC)  Medication refill     Discharge Instructions      Please continue to take amlodipine, chlorthalidone, metformin and insulin as prescribed Establish care with new primary care-contact below Follow-up if not improving or worsening     ED Prescriptions     Medication Sig Dispense Auth. Provider   amLODipine (NORVASC) 5 MG tablet Take 1 tablet (5 mg total) by mouth daily. 90 tablet Lister Brizzi,  Jamieka Royle C, PA-C   chlorthalidone (HYGROTON) 25 MG tablet Take 1 tablet (25 mg total) by mouth daily. 90 tablet Jacaria Colburn C, PA-C   metFORMIN (GLUMETZA) 1000 MG (MOD) 24 hr tablet Take 2 tablets (2,000 mg total) by mouth daily with breakfast. 180 tablet Carisha Kantor C, PA-C   insulin glargine (LANTUS) 100 UNIT/ML injection Inject 0.4 mLs (40 Units total) into the skin at bedtime. 40 mL Ronak Duquette, [place] C, PA-C      PDMP not reviewed this encounter.   Sharyon Cable [facility] C, [place] 11/17/20 [phone]

## 2020-11-17 NOTE — Telephone Encounter (Signed)
Requested pen over insulin injection, switching to [employer]

## 2020-11-17 NOTE — Telephone Encounter (Signed)
Sending over metformin prescription

## 2020-11-17 NOTE — Telephone Encounter (Signed)
Pt notified of new RX sent to pharmacy.

## 2020-11-18 ENCOUNTER — Telehealth (HOSPITAL_BASED_OUTPATIENT_CLINIC_OR_DEPARTMENT_OTHER): Payer: Self-pay

## 2020-11-18 NOTE — Telephone Encounter (Signed)
Called Pt to make NP appt w/ [facility] - PCP for FU after UC

## 2020-11-30 ENCOUNTER — Encounter: Payer: Self-pay | Admitting: Emergency Medicine

## 2020-11-30 ENCOUNTER — Ambulatory Visit
Admission: EM | Admit: 2020-11-30 | Discharge: 2020-11-30 | Disposition: A | Discharge status: Home / Self Care | Payer: BC Managed Care – PPO | Attending: Emergency Medicine | Admitting: Emergency Medicine

## 2020-11-30 ENCOUNTER — Other Ambulatory Visit: Payer: Self-pay

## 2020-11-30 DIAGNOSIS — E11621 Type 2 diabetes mellitus with foot ulcer: Secondary | ICD-10-CM | POA: Diagnosis not present

## 2020-11-30 DIAGNOSIS — L97422 Non-pressure chronic ulcer of left heel and midfoot with fat layer exposed: Secondary | ICD-10-CM | POA: Diagnosis not present

## 2020-11-30 MED ORDER — DOXYCYCLINE HYCLATE 100 MG PO CAPS: 100.0000 mg | ORAL_CAPSULE | Freq: Two times a day (BID) | ORAL | 0 refills | Status: DC

## 2020-11-30 NOTE — ED Triage Notes (Signed)
Patient c/o LFT foot sore x 3-4 days.   Patient endorses being seen by a Podiatrist for a bunion per patient statement.   Patient denies pain.   Patient endorses drainage at times.   History of DM.  Patient has used an antiseptic and dressing w/ no relief of sore.

## 2020-11-30 NOTE — Discharge Instructions (Addendum)
Begin doxycycline twice daily for 10 days Follow-up with wound center-they should contact you Continue dressing changes Elevate foot when resting and let air out Gently cleanse with warm soapy water Follow-up if not improving or worsening

## 2020-12-01 NOTE — ED Provider Notes (Signed)
[facility]    CSN: [id number] Arrival date & time: 11/30/20  1533      History   Chief Complaint Chief Complaint  Patient presents with   Sore    HPI Ernest Edwards is a 60 y.o. male history of hypertension, DM type II presenting today for evaluation of sore to left foot.  Reports that he was previously seeing podiatry for a sore on his bunion, feels this has been improving, but concerned about a sore on the bottom of his foot.  He is unsure exactly how long this has been present.  Denies significant pain associated with this.  Denies drainage.  Denies fevers.  Denies fatigue, dizziness or lightheadedness.  Reports recent x-ray of foot which was normal.  HPI  Past Medical History:  Diagnosis Date   Diabetes mellitus without complication (HCC)    Hypertension     There are no problems to display for this patient.   History reviewed. No pertinent surgical history.     Home Medications    Prior to Admission medications   Medication Sig Start Date End Date Taking? Authorizing Provider  amLODipine (NORVASC) 5 MG tablet Take 1 tablet (5 mg total) by mouth daily. 11/16/20  Yes Houston Zapien C, PA-C  chlorthalidone (HYGROTON) 25 MG tablet Take 1 tablet (25 mg total) by mouth daily. 11/16/20  Yes Verlyn Lambert C, PA-C  doxycycline (VIBRAMYCIN) 100 MG capsule Take 1 capsule (100 mg total) by mouth 2 (two) times daily for 10 days. 11/30/20 12/10/20 Yes Yahaira Bruski C, PA-C  insulin glargine (LANTUS) 100 UNIT/ML Solostar Pen Inject 40 Units into the skin at bedtime. 11/17/20 02/15/21 Yes Rommel Hogston C, PA-C  metFORMIN (GLUCOPHAGE-XR) 500 MG 24 hr tablet Take 4 tablets (2,000 mg total) by mouth daily with breakfast. 11/17/20 02/15/21 Yes Charleen Madera C, PA-C  hydrochlorothiazide (MICROZIDE) 12.5 MG capsule Take 1 capsule (12.5 mg total) by mouth daily. 02/21/14 05/20/19  [name], PA-C  lisinopril (ZESTRIL) 10 MG tablet Take 1 tablet (10 mg total) by mouth  daily. 05/20/19 05/20/19  [name], NP    Family History Family History  Problem Relation Age of Onset   Hypertension Mother    Diabetes Mother    Diabetes Father    Hypertension Father     Social History Social History   Tobacco Use   Smoking status: Never   Smokeless tobacco: Never  Vaping Use   Vaping Use: Never used  Substance Use Topics   Alcohol use: No   Drug use: No     Allergies   Patient has no known allergies.   Review of Systems Review of Systems  Constitutional:  Negative for fatigue and fever.  Eyes:  Negative for redness, itching and visual disturbance.  Respiratory:  Negative for shortness of breath.   Cardiovascular:  Negative for chest pain and leg swelling.  Gastrointestinal:  Negative for nausea and vomiting.  Musculoskeletal:  Negative for arthralgias and myalgias.  Skin:  Positive for color change. Negative for rash and wound.  Neurological:  Negative for dizziness, syncope, weakness, light-headedness and headaches.    Physical Exam Triage Vital Signs ED Triage Vitals  Enc Vitals Group     BP 11/30/20 1620 (!) 166/97     Pulse Rate 11/30/20 1620 90     Resp 11/30/20 1620 12     Temp 11/30/20 1620 98.7 F (37.1 C)     Temp Source 11/30/20 1620 Oral     SpO2 11/30/20  1620 95 %     Weight --      Height --      Head Circumference --      Peak Flow --      Pain Score 11/30/20 1619 0     Pain Loc --      Pain Edu? --      Excl. in GC? --    No data found.  Updated Vital Signs BP (!) 166/97 (BP Location: Right Arm)   Pulse 90   Temp 98.7 F (37.1 C) (Oral)   Resp 12   SpO2 95%   Visual Acuity Right Eye Distance:   Left Eye Distance:   Bilateral Distance:    Right Eye Near:   Left Eye Near:    Bilateral Near:     Physical Exam Vitals and nursing note reviewed.  Constitutional:      Appearance: He is well-developed.     Comments: No acute distress  HENT:     Head: Normocephalic and atraumatic.     Nose: Nose  normal.  Eyes:     Conjunctiva/sclera: Conjunctivae normal.  Cardiovascular:     Rate and Rhythm: Normal rate.  Pulmonary:     Effort: Pulmonary effort is normal. No respiratory distress.  Abdominal:     General: There is no distension.  Musculoskeletal:        General: Normal range of motion.     Cervical back: Neck supple.  Skin:    General: Skin is warm and dry.     Comments: See picture below-plantar surface of left foot with ulcer noted to ball of foot underneath great toe, significant amount of white peeling thickened skin exposing underlying tissue  Neurological:     Mental Status: He is alert and oriented to person, place, and time.      UC Treatments / Results  Labs (all labs ordered are listed, but only abnormal results are displayed) Labs Reviewed - No data to display  EKG   Radiology No results found.  Procedures Procedures (including critical care time)  Medications Ordered in UC Medications - No data to display  Initial Impression / Assessment and Plan / UC Course  I have reviewed the triage vital signs and the nursing notes.  Pertinent labs & imaging results that were available during my care of the patient were reviewed by me and considered in my medical decision making (see chart for details).     Ulcer to left foot-placing on doxycycline to cover for infection, recommended repeat imaging given unclear course of this, patient would like to defer as he reports recent x-ray.  Referral placed to wound center, discussed wound care and dressing changes elevating foot and gentle cleansing.  Discussed strict return precautions. Patient verbalized understanding and is agreeable with plan.  Final Clinical Impressions(s) / UC Diagnoses   Final diagnoses:  Diabetic ulcer of left midfoot associated with type 2 diabetes mellitus, with fat layer exposed (HCC)     Discharge Instructions      Begin doxycycline twice daily for 10 days Follow-up with wound  center-they should contact you Continue dressing changes Elevate foot when resting and let air out Gently cleanse with warm soapy water Follow-up if not improving or worsening     ED Prescriptions     Medication Sig Dispense Auth. Provider   doxycycline (VIBRAMYCIN) 100 MG capsule Take 1 capsule (100 mg total) by mouth 2 (two) times daily for 10 days. 20 capsule Kerri-Anne Haeberle, [place] C, PA-C  PDMP not reviewed this encounter.   Lew Dawes, [place] 12/01/20 [phone]

## 2020-12-08 ENCOUNTER — Telehealth: Payer: Self-pay

## 2020-12-08 ENCOUNTER — Telehealth: Payer: Self-pay | Admitting: Emergency Medicine

## 2020-12-08 MED ORDER — DOXYCYCLINE HYCLATE 100 MG PO CAPS: 100.0000 mg | ORAL_CAPSULE | Freq: Two times a day (BID) | ORAL | 0 refills | Status: AC

## 2020-12-08 NOTE — Telephone Encounter (Signed)
Extending course of doxycycline, patient to follow-up with wound clinic, to ED for further wound management if symptoms worsening

## 2020-12-08 NOTE — Telephone Encounter (Signed)
Pt called about wound to foot management and medication. Provider made aware, prescriptions sent to pharmacy. Patient educated on monitoring status of the wound and made aware that if wound appears to worsen he should seek further evaluation at emergency room as he awaits for Wound clinic appointment.

## 2020-12-30 DIAGNOSIS — E11621 Type 2 diabetes mellitus with foot ulcer: Secondary | ICD-10-CM | POA: Diagnosis not present

## 2020-12-30 DIAGNOSIS — L97522 Non-pressure chronic ulcer of other part of left foot with fat layer exposed: Secondary | ICD-10-CM | POA: Diagnosis not present

## 2021-01-06 ENCOUNTER — Encounter (HOSPITAL_BASED_OUTPATIENT_CLINIC_OR_DEPARTMENT_OTHER): Payer: BC Managed Care – PPO | Admitting: Physician Assistant

## 2021-02-10 DIAGNOSIS — N529 Male erectile dysfunction, unspecified: Secondary | ICD-10-CM | POA: Diagnosis not present

## 2021-02-10 DIAGNOSIS — E1165 Type 2 diabetes mellitus with hyperglycemia: Secondary | ICD-10-CM | POA: Diagnosis not present

## 2021-02-10 DIAGNOSIS — I152 Hypertension secondary to endocrine disorders: Secondary | ICD-10-CM | POA: Diagnosis not present

## 2021-02-10 DIAGNOSIS — Z0000 Encounter for general adult medical examination without abnormal findings: Secondary | ICD-10-CM | POA: Diagnosis not present

## 2021-02-10 DIAGNOSIS — E1159 Type 2 diabetes mellitus with other circulatory complications: Secondary | ICD-10-CM | POA: Diagnosis not present

## 2021-02-26 ENCOUNTER — Encounter: Payer: Self-pay | Admitting: Emergency Medicine

## 2021-02-26 ENCOUNTER — Emergency Department (HOSPITAL_BASED_OUTPATIENT_CLINIC_OR_DEPARTMENT_OTHER): Payer: BC Managed Care – PPO

## 2021-02-26 ENCOUNTER — Other Ambulatory Visit: Payer: Self-pay

## 2021-02-26 ENCOUNTER — Emergency Department (HOSPITAL_BASED_OUTPATIENT_CLINIC_OR_DEPARTMENT_OTHER)
Admission: EM | Admit: 2021-02-26 | Discharge: 2021-02-26 | Disposition: A | Discharge status: Home / Self Care | Payer: BC Managed Care – PPO | Attending: Emergency Medicine | Admitting: Emergency Medicine

## 2021-02-26 ENCOUNTER — Ambulatory Visit
Admission: EM | Admit: 2021-02-26 | Discharge: 2021-02-26 | Disposition: A | Discharge status: Home / Self Care | Payer: BC Managed Care – PPO

## 2021-02-26 ENCOUNTER — Encounter (HOSPITAL_BASED_OUTPATIENT_CLINIC_OR_DEPARTMENT_OTHER): Payer: Self-pay | Admitting: *Deleted

## 2021-02-26 DIAGNOSIS — Z794 Long term (current) use of insulin: Secondary | ICD-10-CM | POA: Insufficient documentation

## 2021-02-26 DIAGNOSIS — L97529 Non-pressure chronic ulcer of other part of left foot with unspecified severity: Secondary | ICD-10-CM | POA: Diagnosis not present

## 2021-02-26 DIAGNOSIS — I10 Essential (primary) hypertension: Secondary | ICD-10-CM | POA: Diagnosis not present

## 2021-02-26 DIAGNOSIS — R600 Localized edema: Secondary | ICD-10-CM | POA: Diagnosis not present

## 2021-02-26 DIAGNOSIS — E11621 Type 2 diabetes mellitus with foot ulcer: Secondary | ICD-10-CM | POA: Diagnosis not present

## 2021-02-26 DIAGNOSIS — L97522 Non-pressure chronic ulcer of other part of left foot with fat layer exposed: Secondary | ICD-10-CM

## 2021-02-26 DIAGNOSIS — Z7984 Long term (current) use of oral hypoglycemic drugs: Secondary | ICD-10-CM | POA: Diagnosis not present

## 2021-02-26 DIAGNOSIS — Z79899 Other long term (current) drug therapy: Secondary | ICD-10-CM | POA: Diagnosis not present

## 2021-02-26 DIAGNOSIS — M7989 Other specified soft tissue disorders: Secondary | ICD-10-CM | POA: Diagnosis not present

## 2021-02-26 LAB — BASIC METABOLIC PANEL
Anion gap: 9 (ref 5–15)
BUN: 12 mg/dL (ref 6–20)
CO2: 27 mmol/L (ref 22–32)
Calcium: 9.5 mg/dL (ref 8.9–10.3)
Chloride: 104 mmol/L (ref 98–111)
Creatinine, Ser: 1.17 mg/dL (ref 0.61–1.24)
GFR, Estimated: >60 mL/min (ref >60)
Glucose, Bld: 105 mg/dL — ABNORMAL HIGH (ref 70–99)
Potassium: 3.8 mmol/L (ref 3.5–5.1)
Sodium: 140 mmol/L (ref 135–145)

## 2021-02-26 LAB — CBC WITH DIFFERENTIAL/PLATELET
Abs Immature Granulocytes: 0.01 10*3/uL (ref 0.00–0.07)
Basophils Absolute: 0 10*3/uL (ref 0.0–0.1)
Basophils Relative: 0 %
Eosinophils Absolute: 0.3 10*3/uL (ref 0.0–0.5)
Eosinophils Relative: 5 %
HCT: 33.4 % — ABNORMAL LOW (ref 39.0–52.0)
Hemoglobin: 10.9 g/dL — ABNORMAL LOW (ref 13.0–17.0)
Immature Granulocytes: 0 %
Lymphocytes Relative: 37 %
Lymphs Abs: 2.1 10*3/uL (ref 0.7–4.0)
MCH: 26.4 pg (ref 26.0–34.0)
MCHC: 32.6 g/dL (ref 30.0–36.0)
MCV: 80.9 fL (ref 80.0–100.0)
Monocytes Absolute: 0.5 10*3/uL (ref 0.1–1.0)
Monocytes Relative: 8 %
Neutro Abs: 2.8 10*3/uL (ref 1.7–7.7)
Neutrophils Relative %: 50 %
Platelets: 300 10*3/uL (ref 150–400)
RBC: 4.13 MIL/uL — ABNORMAL LOW (ref 4.22–5.81)
RDW: 14.4 % (ref 11.5–15.5)
WBC: 5.7 10*3/uL (ref 4.0–10.5)
nRBC: 0 % (ref 0.0–0.2)

## 2021-02-26 LAB — SEDIMENTATION RATE: Sed Rate: 50 mm/hr — ABNORMAL HIGH (ref 0–16)

## 2021-02-26 MED ORDER — DOXYCYCLINE HYCLATE 100 MG PO CAPS: 100.0000 mg | ORAL_CAPSULE | Freq: Two times a day (BID) | ORAL | 0 refills | Status: DC

## 2021-02-26 NOTE — ED Triage Notes (Signed)
Sent here from UC due to left leg swelling for a few days, small ulcer on left foot with drainage. Ulcer has been present for about a month. He is receiving treatment to the ulcer.

## 2021-02-26 NOTE — ED Notes (Signed)
Patient is being discharged from the Urgent Care and sent to the Emergency Department via private vehicle . Per Lillia Abed, patient is in need of higher level of care due to cellulitis, bleeding ulcer to foot, +3 edema, hx of infection and antibiotic treatment without relief. Patient is aware and verbalizes understanding of plan of care.  Vitals:   02/26/21 1551  BP: (!) 190/123  Pulse: 83  Resp: 20  SpO2: 97%

## 2021-02-26 NOTE — ED Notes (Signed)
Signature pad not working at time of d/c, pt verbalized understanding of d/c paperwork.

## 2021-02-26 NOTE — ED Provider Notes (Signed)
[facility] EMERGENCY DEPARTMENT Provider Note  CSN: [id number] Arrival date & time: 02/26/21 1633    History Chief Complaint  Patient presents with   Leg Swelling    Ernest Edwards is a 60 y.o. male with history of DM and chronic left foot wound reports he has had difficulty getting follow up with podiatry and/or wound care center. He was seen by PCP last week and feels like it is getting better but it has been present since at least August and is not healing as fast as he would like. He has not had any pain, drainage or fever, but noticed some swelling in his L leg today that worried him. He has a history of LE edema and was previously on diuretics but states his doctor took him off them a while ago.    Past Medical History:  Diagnosis Date   Diabetes mellitus without complication (HCC)    Hypertension     History reviewed. No pertinent surgical history.  Family History  Problem Relation Age of Onset   Hypertension Mother    Diabetes Mother    Diabetes Father    Hypertension Father     Social History   Tobacco Use   Smoking status: Never   Smokeless tobacco: Never  Vaping Use   Vaping Use: Never used  Substance Use Topics   Alcohol use: No   Drug use: No     Home Medications Prior to Admission medications   Medication Sig Start Date End Date Taking? Authorizing Provider  doxycycline (VIBRAMYCIN) 100 MG capsule Take 1 capsule (100 mg total) by mouth 2 (two) times daily. 02/26/21  Yes Pollyann Savoy, MD  amLODipine (NORVASC) 5 MG tablet Take 1 tablet (5 mg total) by mouth daily. 11/16/20   Wieters, Hallie C, PA-C  chlorthalidone (HYGROTON) 25 MG tablet Take 1 tablet (25 mg total) by mouth daily. 11/16/20   Wieters, Hallie C, PA-C  insulin glargine (LANTUS) 100 UNIT/ML Solostar Pen Inject 40 Units into the skin at bedtime. 11/17/20 02/15/21  Wieters, Hallie C, PA-C  metFORMIN (GLUCOPHAGE-XR) 500 MG 24 hr tablet Take 4 tablets (2,000 mg total) by mouth  daily with breakfast. 11/17/20 02/15/21  Wieters, Hallie C, PA-C  valsartan (DIOVAN) 80 MG tablet Take 80 mg by mouth daily.    [provider]  hydrochlorothiazide (MICROZIDE) 12.5 MG capsule Take 1 capsule (12.5 mg total) by mouth daily. 02/21/14 05/20/19  [name], PA-C  lisinopril (ZESTRIL) 10 MG tablet Take 1 tablet (10 mg total) by mouth daily. 05/20/19 05/20/19  [name], NP     Allergies    Patient has no known allergies.   Review of Systems   Review of Systems A comprehensive review of systems was completed and negative except as noted in HPI.    Physical Exam BP (!) 169/99 (BP Location: Left Arm)   Pulse 83   Temp 98.5 F (36.9 C) (Oral)   Resp 16   Ht 6\' 2"  (1.88 m)   Wt 124.7 kg   SpO2 100%   BMI 35.31 kg/m   Physical Exam Vitals and nursing note reviewed.  Constitutional:      Appearance: Normal appearance.  HENT:     Head: Normocephalic and atraumatic.     Nose: Nose normal.     Mouth/Throat:     Mouth: Mucous membranes are moist.  Eyes:     Extraocular Movements: Extraocular movements intact.     Conjunctiva/sclera: Conjunctivae normal.  Cardiovascular:  Rate and Rhythm: Normal rate.  Pulmonary:     Effort: Pulmonary effort is normal.     Breath sounds: Normal breath sounds.  Abdominal:     General: Abdomen is flat.     Palpations: Abdomen is soft.     Tenderness: There is no abdominal tenderness.  Musculoskeletal:        General: No swelling. Normal range of motion.     Cervical back: Neck supple.     Comments: BLE Edema L>R; there is an ulcer on plantar L foot without significant drainage, erythema or warmth. Not tender to palpation  Skin:    General: Skin is warm and dry.  Neurological:     General: No focal deficit present.     Mental Status: He is alert.  Psychiatric:        Mood and Affect: Mood normal.      ED Results / Procedures / Treatments   Labs (all labs ordered are listed, but only abnormal results are  displayed) Labs Reviewed  BASIC METABOLIC PANEL - Abnormal; Notable for the following components:      Result Value   Glucose, Bld 105 (*)    All other components within normal limits  CBC WITH DIFFERENTIAL/PLATELET - Abnormal; Notable for the following components:   RBC 4.13 (*)    Hemoglobin 10.9 (*)    HCT 33.4 (*)    All other components within normal limits  SEDIMENTATION RATE - Abnormal; Notable for the following components:   Sed Rate 50 (*)    All other components within normal limits    EKG None   Radiology US Venous Img Lower  Left (DVT Study)  Result Date: 02/26/2021 CLINICAL DATA:  Left leg swelling. Rule out DVT. Technologist notes state left foot ulcer. EXAM: LEFT LOWER EXTREMITY VENOUS DOPPLER ULTRASOUND TECHNIQUE: Gray-scale sonography with compression, as well as color and duplex ultrasound, were performed to evaluate the deep venous system(s) from the level of the common femoral vein through the popliteal and proximal calf veins. COMPARISON:  None. FINDINGS: VENOUS Normal compressibility of the common femoral, superficial femoral, and popliteal veins, as well as the visualized calf veins. Peroneal vein is not well seen. Visualized portions of profunda femoral vein and great saphenous vein unremarkable. No filling defects to suggest DVT on grayscale or color Doppler imaging. Doppler waveforms show normal direction of venous flow, normal respiratory plasticity and response to augmentation. Limited views of the contralateral common femoral vein are unremarkable. OTHER Enlarged left groin lymph node. This node measures 2.7 cm in short axis. Calf edema. Limitations: none IMPRESSION: 1. No evidence of left lower extremity DVT. 2. Left calf soft tissue edema. 3. Enlarged left groin lymph node measuring 2.7 cm in short axis dimension. This is larger than typically seen with reactive node. Recommend correlation with clinical history. Neoplastic node, either due to lymphoproliferative  disorder or metastasis is considered. Electronically Signed   By: Narda Rutherford M.D.   On: 02/26/2021 18:29   DG Foot Complete Left  Result Date: 02/26/2021 CLINICAL DATA:  Plantar ulcer.  Evaluate for osteomyelitis. EXAM: LEFT FOOT - COMPLETE 3+ VIEW COMPARISON:  None. FINDINGS: Soft tissue defect is seen over the plantar aspect of the metatarsal-phalangeal joint, seen only on the lateral view. No evidence of subjacent osteomyelitis, bony destruction, or erosion. Hammertoe deformity of the digits. Deformity of the great toe proximal phalanx suggest remote prior fracture. Prior transmetatarsal amputation of the fifth ray. Moderate midfoot degenerative change. Moderate plantar calcaneal spur.  Generalized soft tissue edema. No tracking soft tissue air. No radiopaque foreign body. IMPRESSION: 1. Soft tissue defect over the plantar aspect of the forefoot. No radiographic evidence of osteomyelitis. 2. Prior transmetatarsal amputation of the fifth ray. 3. Moderate midfoot degenerative change. Moderate plantar calcaneal spur. Electronically Signed   By: Narda Rutherford M.D.   On: 02/26/2021 18:31    Procedures Procedures  Medications Ordered in the ED Medications - No data to display   MDM Rules/Calculators/A&P MDM  Patient with chronic foot ulcer, now with leg swelling (L>R) will check labs, xray, [place] and reassess.   ED Course  I have reviewed the triage vital signs and the nursing notes.  Pertinent labs & imaging results that were available during my care of the patient were reviewed by me and considered in my medical decision making (see chart for details).  Clinical Course as of 02/26/21 1916  Fri Feb 26, 2021  1736 CBC with mild anemia, no leukocytosis.  [CS]  1813 BMP normal, no significant hyperglycemia [CS]  1833 [place] neg for DVT, enlarged lymph node is likely reactive from his foot ulcer, but will need PCP follow up for surveillance.  [CS]  1902 Sed rate is mildly elevated but no  evidence of osteo on xray. Plan discharge with Rx for doxycycline. Recommend Podiatry and/or Wound Care follow up for long term management.  [CS]    Clinical Course User Index [CS] Pollyann Savoy, MD    Final Clinical Impression(s) / ED Diagnoses Final diagnoses:  Diabetic ulcer of other part of left foot associated with type 2 diabetes mellitus, with fat layer exposed (HCC)    Rx / DC Orders ED Discharge Orders          Ordered    doxycycline (VIBRAMYCIN) 100 MG capsule  2 times daily        02/26/21 1916             Pollyann Savoy, MD 02/26/21 1916

## 2021-02-26 NOTE — ED Triage Notes (Signed)
Patient presents to [facility] for evaluation of left leg swelling x 2-3 days.  Patient states he has an appointment for his PCP on Wednesday but "wanted someone to lay eyes on it before then".  C/o swelling, denies pain.  This RN notes some redness.

## 2021-03-04 ENCOUNTER — Ambulatory Visit (INDEPENDENT_AMBULATORY_CARE_PROVIDER_SITE_OTHER): Payer: BC Managed Care – PPO | Admitting: Podiatry

## 2021-03-04 ENCOUNTER — Other Ambulatory Visit: Payer: Self-pay

## 2021-03-04 DIAGNOSIS — L97422 Non-pressure chronic ulcer of left heel and midfoot with fat layer exposed: Secondary | ICD-10-CM

## 2021-03-04 DIAGNOSIS — E11621 Type 2 diabetes mellitus with foot ulcer: Secondary | ICD-10-CM

## 2021-03-04 MED ORDER — MUPIROCIN 2 % EX OINT: 1.0000 "application " | TOPICAL_OINTMENT | Freq: Every day | CUTANEOUS | 2 refills | Status: DC

## 2021-03-08 NOTE — Progress Notes (Signed)
  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 male presents with the above complaint. History confirmed with patient.  His A1c is 7.0%.  He has been seeing a podiatrist at [facility] for wound care.  Had recent x-rays.  Has been taking doxycycline since 02/26/2021  Objective:  Physical Exam: warm, good capillary refill, normal DP and PT pulses, and abnormal sensation loss of protective sensation, he has a full-thickness ulceration on the plantar first MTPJ measuring 2.0 x 1.5 x 0.7 cm.  Subcutaneous tissue is exposed no exposed bone tendon or joint no malodor purulence.  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     I reviewed his left foot x-rays from 02/26/2021 no evidence of osteolysis or osteomyelitis Assessment:  No diagnosis found.   Plan:  Patient was evaluated and treated and all questions answered.  Ulcer left foot -We discussed the etiology and factors that are a part of the wound healing process.  We also discussed the risk of infection both soft tissue and osteomyelitis from open ulceration.  Discussed the risk of limb loss if this happens or worsens. -Debridement as below. -Dressed with Iodosorb, DSD.  Rx for Bactroban to use daily at home -I recommend we begin offloading I discussed the rationale behind this and how this impacts wound healing.  Recommended using a peg assist in surgical shoe but he would prefer to continue his regular shoe gear.  I advised against this but he would like to continue to do this.  I did dispense him dancers pads to offload the lesion hopefully this will be some sort of a compromise that will take pressure off of it and allow to heal some  Procedure: Excisional Debridement  of Wound Rationale: Removal of non-viable soft tissue from the wound to promote healing.  Anesthesia: none Post-Debridement Wound Measurements: 2.0 x 1.5 x 0.7 cm Type of Debridement: Sharp Excisional Tissue Removed: Non-viable soft tissue Depth of Debridement: subcutaneous tissue. Technique: Sharp excisional debridement to bleeding, viable wound base.  Dressing: Dry, sterile, compression dressing. Disposition: Patient tolerated procedure well.   Return in about 2 weeks (around 03/18/2021) for wound care.

## 2021-03-22 ENCOUNTER — Other Ambulatory Visit: Payer: Self-pay

## 2021-03-22 ENCOUNTER — Ambulatory Visit (INDEPENDENT_AMBULATORY_CARE_PROVIDER_SITE_OTHER): Payer: BC Managed Care – PPO | Admitting: Podiatry

## 2021-03-22 DIAGNOSIS — E11621 Type 2 diabetes mellitus with foot ulcer: Secondary | ICD-10-CM

## 2021-03-22 DIAGNOSIS — L97422 Non-pressure chronic ulcer of left heel and midfoot with fat layer exposed: Secondary | ICD-10-CM

## 2021-03-23 NOTE — Progress Notes (Signed)
  Subjective:  Patient ID: Ernest Edwards, male    DOB: January 03, 1961,  MRN: [id number]  Chief Complaint  Patient presents with   Diabetic Ulcer    3 week follow up left     60 y.o. male presents with the above complaint. History confirmed with patient.  Has been using the mupirocin ointment and dressing daily  Objective:  Physical Exam: warm, good capillary refill, normal DP and PT pulses, and abnormal sensation loss of protective sensation, he has a full-thickness ulceration on the plantar first MTPJ measuring 1.5 x 1.6 x 0.3 cm.  Subcutaneous tissue is exposed no exposed bone tendon or joint no malodor purulence.  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      I reviewed his left foot x-rays from 02/26/2021 no evidence of osteolysis or osteomyelitis Assessment:   1. Diabetic ulcer of left midfoot associated with type 2 diabetes mellitus, with fat layer exposed (HCC)      Plan:  Patient was evaluated and treated and all questions answered.  Ulcer left foot -We discussed the etiology and factors that are a part of the wound healing process.  We also discussed the risk of infection both soft tissue and osteomyelitis from open ulceration.  Discussed the risk of limb loss if this happens or worsens. -Debridement as below. -Dressed with Iodosorb, DSD.  Continue Bactroban to use daily at home   Procedure: Excisional Debridement of Wound Rationale: Removal of non-viable soft tissue from the wound to promote healing.  Anesthesia: none Post-Debridement Wound Measurements: 1.5 x 1.6 x 0.3 cm Type of Debridement: Sharp Excisional Tissue Removed: Non-viable soft tissue Depth of Debridement: subcutaneous tissue. Technique: Sharp excisional debridement to bleeding, viable wound base.  Dressing: Dry, sterile, compression dressing. Disposition: Patient tolerated procedure well.   Return in about 3 weeks (around 04/12/2021) for wound care.

## 2021-04-06 DIAGNOSIS — N529 Male erectile dysfunction, unspecified: Secondary | ICD-10-CM | POA: Diagnosis not present

## 2021-04-06 DIAGNOSIS — E1159 Type 2 diabetes mellitus with other circulatory complications: Secondary | ICD-10-CM | POA: Diagnosis not present

## 2021-04-06 DIAGNOSIS — I152 Hypertension secondary to endocrine disorders: Secondary | ICD-10-CM | POA: Diagnosis not present

## 2021-04-06 DIAGNOSIS — Z794 Long term (current) use of insulin: Secondary | ICD-10-CM | POA: Diagnosis not present

## 2021-04-13 ENCOUNTER — Ambulatory Visit (INDEPENDENT_AMBULATORY_CARE_PROVIDER_SITE_OTHER): Payer: BC Managed Care – PPO | Admitting: Podiatry

## 2021-04-13 ENCOUNTER — Other Ambulatory Visit: Payer: Self-pay

## 2021-04-13 DIAGNOSIS — E11621 Type 2 diabetes mellitus with foot ulcer: Secondary | ICD-10-CM | POA: Diagnosis not present

## 2021-04-13 DIAGNOSIS — L97422 Non-pressure chronic ulcer of left heel and midfoot with fat layer exposed: Secondary | ICD-10-CM

## 2021-04-19 ENCOUNTER — Encounter: Payer: Self-pay | Admitting: Podiatry

## 2021-04-19 NOTE — Progress Notes (Signed)
°  Subjective:  Patient ID: Ernest Edwards, male    DOB: 01-08-1961,  MRN: [id number]  Chief Complaint  Patient presents with   Wound Check    F/U LT ulcer check -pt states," I think it's looking better and smaller." - no pain/redness/swelling -w/ bleeding Tx: bactrban ointment daily    Diabetes    FBS: 148 a1C: 7     61 y.o. male presents with the above complaint. History confirmed with patient.  Has been using the mupirocin ointment and dressing daily  Objective:  Physical Exam: warm, good capillary refill, normal DP and PT pulses, and abnormal sensation loss of protective sensation, he has a full-thickness ulceration on the plantar first MTPJ measuring 1.0x1.1 x 0.3 cm.  Subcutaneous tissue is exposed no exposed bone tendon or joint no malodor purulence.  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       I reviewed his left foot x-rays from 02/26/2021 no evidence of osteolysis or osteomyelitis Assessment:   1. Diabetic ulcer of left midfoot associated with type 2 diabetes mellitus, with fat layer exposed (HCC)      Plan:  Patient was evaluated and treated and all questions answered.  Ulcer left foot -We discussed the etiology and factors that are a part of the wound healing process.  We also discussed the risk of infection both soft tissue and osteomyelitis from open ulceration.  Discussed the risk of limb loss if this happens or worsens. -Debridement as below. -Dressed with Iodosorb, DSD.  Continue Bactroban to use daily at home   Procedure: Excisional Debridement of Wound Rationale: Removal of non-viable soft tissue from the wound to promote healing.  Anesthesia: none Post-Debridement Wound Measurements: 1.0x1.1 x 0.3 cm Type of Debridement: Sharp Excisional Tissue Removed: Non-viable soft tissue Depth of Debridement: subcutaneous tissue. Technique: Sharp excisional debridement to bleeding, viable wound base.  Dressing: Dry, sterile, compression  dressing. Disposition: Patient tolerated procedure well.   Return in about 3 weeks (around 05/04/2021).

## 2021-04-21 ENCOUNTER — Encounter (HOSPITAL_BASED_OUTPATIENT_CLINIC_OR_DEPARTMENT_OTHER): Payer: BC Managed Care – PPO | Admitting: Physician Assistant

## 2021-05-04 ENCOUNTER — Ambulatory Visit: Payer: BC Managed Care – PPO | Admitting: Podiatry

## 2021-05-12 ENCOUNTER — Ambulatory Visit (INDEPENDENT_AMBULATORY_CARE_PROVIDER_SITE_OTHER): Payer: Self-pay | Admitting: Podiatry

## 2021-05-12 DIAGNOSIS — Z91199 Patient's noncompliance with other medical treatment and regimen due to unspecified reason: Secondary | ICD-10-CM

## 2021-05-12 NOTE — Progress Notes (Signed)
No show

## 2021-06-10 ENCOUNTER — Telehealth: Payer: Self-pay | Admitting: Podiatry

## 2021-06-10 MED ORDER — DOXYCYCLINE HYCLATE 100 MG PO CAPS: 100.0000 mg | ORAL_CAPSULE | Freq: Two times a day (BID) | ORAL | 0 refills | Status: DC

## 2021-06-10 NOTE — Telephone Encounter (Signed)
Needs antibiotics called in to pharmacy. [facility] on [address]. He states he is having pain when he is up on the left foot for work.

## 2021-06-10 NOTE — Telephone Encounter (Signed)
Notified pt rx was sent in ?

## 2021-06-17 ENCOUNTER — Inpatient Hospital Stay (HOSPITAL_COMMUNITY)
Admission: EM | Admit: 2021-06-17 | Discharge: 2021-06-22 | DRG: 617 | Disposition: A | Discharge status: Home / Self Care | Payer: BC Managed Care – PPO | Attending: Internal Medicine | Admitting: Internal Medicine

## 2021-06-17 ENCOUNTER — Ambulatory Visit (INDEPENDENT_AMBULATORY_CARE_PROVIDER_SITE_OTHER): Payer: BC Managed Care – PPO

## 2021-06-17 ENCOUNTER — Other Ambulatory Visit: Payer: Self-pay

## 2021-06-17 ENCOUNTER — Ambulatory Visit (INDEPENDENT_AMBULATORY_CARE_PROVIDER_SITE_OTHER): Payer: BC Managed Care – PPO | Admitting: Podiatry

## 2021-06-17 DIAGNOSIS — M86472 Chronic osteomyelitis with draining sinus, left ankle and foot: Secondary | ICD-10-CM

## 2021-06-17 DIAGNOSIS — Z91199 Patient's noncompliance with other medical treatment and regimen due to unspecified reason: Secondary | ICD-10-CM

## 2021-06-17 DIAGNOSIS — M86172 Other acute osteomyelitis, left ankle and foot: Secondary | ICD-10-CM | POA: Diagnosis not present

## 2021-06-17 DIAGNOSIS — Z79899 Other long term (current) drug therapy: Secondary | ICD-10-CM

## 2021-06-17 DIAGNOSIS — Z794 Long term (current) use of insulin: Secondary | ICD-10-CM | POA: Diagnosis not present

## 2021-06-17 DIAGNOSIS — E871 Hypo-osmolality and hyponatremia: Secondary | ICD-10-CM | POA: Diagnosis present

## 2021-06-17 DIAGNOSIS — N179 Acute kidney failure, unspecified: Secondary | ICD-10-CM | POA: Diagnosis not present

## 2021-06-17 DIAGNOSIS — M19072 Primary osteoarthritis, left ankle and foot: Secondary | ICD-10-CM | POA: Diagnosis not present

## 2021-06-17 DIAGNOSIS — E11628 Type 2 diabetes mellitus with other skin complications: Secondary | ICD-10-CM | POA: Diagnosis present

## 2021-06-17 DIAGNOSIS — M869 Osteomyelitis, unspecified: Secondary | ICD-10-CM | POA: Diagnosis present

## 2021-06-17 DIAGNOSIS — L97529 Non-pressure chronic ulcer of other part of left foot with unspecified severity: Secondary | ICD-10-CM | POA: Diagnosis not present

## 2021-06-17 DIAGNOSIS — S98912A Complete traumatic amputation of left foot, level unspecified, initial encounter: Secondary | ICD-10-CM | POA: Diagnosis not present

## 2021-06-17 DIAGNOSIS — L97509 Non-pressure chronic ulcer of other part of unspecified foot with unspecified severity: Secondary | ICD-10-CM | POA: Diagnosis not present

## 2021-06-17 DIAGNOSIS — E861 Hypovolemia: Secondary | ICD-10-CM | POA: Diagnosis not present

## 2021-06-17 DIAGNOSIS — L02612 Cutaneous abscess of left foot: Secondary | ICD-10-CM | POA: Diagnosis not present

## 2021-06-17 DIAGNOSIS — M868X7 Other osteomyelitis, ankle and foot: Secondary | ICD-10-CM | POA: Diagnosis not present

## 2021-06-17 DIAGNOSIS — Z20822 Contact with and (suspected) exposure to covid-19: Secondary | ICD-10-CM | POA: Diagnosis present

## 2021-06-17 DIAGNOSIS — Z6836 Body mass index (BMI) 36.0-36.9, adult: Secondary | ICD-10-CM | POA: Diagnosis not present

## 2021-06-17 DIAGNOSIS — E669 Obesity, unspecified: Secondary | ICD-10-CM | POA: Diagnosis present

## 2021-06-17 DIAGNOSIS — L97424 Non-pressure chronic ulcer of left heel and midfoot with necrosis of bone: Secondary | ICD-10-CM | POA: Diagnosis present

## 2021-06-17 DIAGNOSIS — E1169 Type 2 diabetes mellitus with other specified complication: Principal | ICD-10-CM | POA: Diagnosis present

## 2021-06-17 DIAGNOSIS — Z7984 Long term (current) use of oral hypoglycemic drugs: Secondary | ICD-10-CM

## 2021-06-17 DIAGNOSIS — Z9889 Other specified postprocedural states: Secondary | ICD-10-CM

## 2021-06-17 DIAGNOSIS — E11621 Type 2 diabetes mellitus with foot ulcer: Secondary | ICD-10-CM | POA: Diagnosis not present

## 2021-06-17 DIAGNOSIS — D638 Anemia in other chronic diseases classified elsewhere: Secondary | ICD-10-CM | POA: Diagnosis not present

## 2021-06-17 DIAGNOSIS — D75839 Thrombocytosis, unspecified: Secondary | ICD-10-CM | POA: Diagnosis present

## 2021-06-17 DIAGNOSIS — M7732 Calcaneal spur, left foot: Secondary | ICD-10-CM | POA: Diagnosis not present

## 2021-06-17 DIAGNOSIS — L97423 Non-pressure chronic ulcer of left heel and midfoot with necrosis of muscle: Secondary | ICD-10-CM

## 2021-06-17 DIAGNOSIS — I10 Essential (primary) hypertension: Secondary | ICD-10-CM | POA: Diagnosis not present

## 2021-06-17 DIAGNOSIS — Z8249 Family history of ischemic heart disease and other diseases of the circulatory system: Secondary | ICD-10-CM | POA: Diagnosis not present

## 2021-06-17 DIAGNOSIS — L03032 Cellulitis of left toe: Secondary | ICD-10-CM

## 2021-06-17 DIAGNOSIS — E785 Hyperlipidemia, unspecified: Secondary | ICD-10-CM | POA: Diagnosis present

## 2021-06-17 DIAGNOSIS — Z833 Family history of diabetes mellitus: Secondary | ICD-10-CM

## 2021-06-17 DIAGNOSIS — E1165 Type 2 diabetes mellitus with hyperglycemia: Secondary | ICD-10-CM | POA: Diagnosis not present

## 2021-06-17 DIAGNOSIS — D649 Anemia, unspecified: Secondary | ICD-10-CM | POA: Diagnosis not present

## 2021-06-17 DIAGNOSIS — M86672 Other chronic osteomyelitis, left ankle and foot: Secondary | ICD-10-CM | POA: Diagnosis not present

## 2021-06-17 DIAGNOSIS — L0390 Cellulitis, unspecified: Secondary | ICD-10-CM | POA: Diagnosis not present

## 2021-06-17 DIAGNOSIS — Z89432 Acquired absence of left foot: Secondary | ICD-10-CM | POA: Diagnosis not present

## 2021-06-17 DIAGNOSIS — L0889 Other specified local infections of the skin and subcutaneous tissue: Secondary | ICD-10-CM | POA: Diagnosis not present

## 2021-06-17 DIAGNOSIS — I96 Gangrene, not elsewhere classified: Secondary | ICD-10-CM | POA: Diagnosis not present

## 2021-06-17 DIAGNOSIS — L089 Local infection of the skin and subcutaneous tissue, unspecified: Secondary | ICD-10-CM

## 2021-06-17 LAB — BASIC METABOLIC PANEL
Anion gap: 12 (ref 5–15)
BUN: 27 mg/dL — ABNORMAL HIGH (ref 6–20)
CO2: 22 mmol/L (ref 22–32)
Calcium: 9.3 mg/dL (ref 8.9–10.3)
Chloride: 98 mmol/L (ref 98–111)
Creatinine, Ser: 1.35 mg/dL — ABNORMAL HIGH (ref 0.61–1.24)
GFR, Estimated: >60 mL/min (ref >60)
Glucose, Bld: 164 mg/dL — ABNORMAL HIGH (ref 70–99)
Potassium: 3.9 mmol/L (ref 3.5–5.1)
Sodium: 132 mmol/L — ABNORMAL LOW (ref 135–145)

## 2021-06-17 LAB — CBC WITH DIFFERENTIAL/PLATELET
Abs Immature Granulocytes: 0.03 10*3/uL (ref 0.00–0.07)
Basophils Absolute: 0 10*3/uL (ref 0.0–0.1)
Basophils Relative: 1 %
Eosinophils Absolute: 0.2 10*3/uL (ref 0.0–0.5)
Eosinophils Relative: 3 %
HCT: 34.5 % — ABNORMAL LOW (ref 39.0–52.0)
Hemoglobin: 11.1 g/dL — ABNORMAL LOW (ref 13.0–17.0)
Immature Granulocytes: 0 %
Lymphocytes Relative: 20 %
Lymphs Abs: 1.6 10*3/uL (ref 0.7–4.0)
MCH: 25.9 pg — ABNORMAL LOW (ref 26.0–34.0)
MCHC: 32.2 g/dL (ref 30.0–36.0)
MCV: 80.6 fL (ref 80.0–100.0)
Monocytes Absolute: 0.7 10*3/uL (ref 0.1–1.0)
Monocytes Relative: 9 %
Neutro Abs: 5.2 10*3/uL (ref 1.7–7.7)
Neutrophils Relative %: 67 %
Platelets: 441 10*3/uL — ABNORMAL HIGH (ref 150–400)
RBC: 4.28 MIL/uL (ref 4.22–5.81)
RDW: 13.5 % (ref 11.5–15.5)
WBC: 7.8 10*3/uL (ref 4.0–10.5)
nRBC: 0 % (ref 0.0–0.2)

## 2021-06-17 MED ORDER — SODIUM CHLORIDE 0.9 % IV SOLN: 1000.0000 mL | INTRAVENOUS | Status: DC

## 2021-06-17 MED ORDER — SODIUM CHLORIDE 0.9 % IV BOLUS (SEPSIS)
1000.0000 mL | Freq: Once | INTRAVENOUS | Status: AC
  Administered 2021-06-18: 1000 mL via INTRAVENOUS

## 2021-06-17 NOTE — Progress Notes (Signed)
?Subjective:  ?Patient ID: Ernest Edwards, male    DOB: 02-Mar-1961,  MRN: [id number] ? ?Chief Complaint  ?Patient presents with  ? Diabetic Ulcer  ? ? ?61 y.o. male presents with the above complaint. History confirmed with patient.  Returns today for follow-up on his ulcer of his left foot.  I have not seen him since December 27 of last year.  He had a visit scheduled with Dr. Blenda Mounts on 1/25 but did not come.  He has been taking the doxycycline that I called in for him on 06/10/2021. ? ?Objective:  ?Physical Exam: ?warm, good capillary refill, normal DP and PT pulses, and abnormal sensation loss of protective sensation, he has a full-thickness ulceration on the plantar first MTPJ worsened in size and characteristics now approximately 2 cm in diameter and circular.  Probes directly to bone joint and tendon.  Malodorous with serous drainage.  There is a sinus tract to the medial first MTPJ that is draining to the skin here ? ? ?New radiographs taken today show significant osteolysis of the medial sesamoid as well as the medial base of the proximal phalanx.  This is concerning for osteomyelitis ?Assessment:  ? ?1. Diabetic ulcer of left midfoot associated with type 2 diabetes mellitus, with necrosis of bone ([place])   ?2. 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 Type 2 diabetes mellitus with left diabetic foot infection ([place])   ?4. Cellulitis and abscess of toe of left foot   ? ? ? ?Plan:  ?Patient was evaluated and treated and all questions answered. ? ?Ulcer left foot ?Today I had a long discussion with the patient regarding the progress of his ulceration.  It is significantly worsened and now probes to bone.  My chief concern is for diabetic foot infection with osteomyelitis of at least the sesamoid complex possibly further into the first MTPJ metatarsal head and base of the proximal phalanx.  I discussed with him treatment of osteomyelitis is complex and typically requires advanced imaging,  surgical resection and long-term IV antibiotics.  He is at high risk of limb loss and amputation.  He is certainly frustrated with the progression of the wound and feels like it was doing well at our last visit together.  He feels like more should have been done to help heal the wound.  I discussed with him that on her first consultation I recommended on offloading peg assist device with a surgical shoe and he declined to use this against my best medical advice and preferred using his own regular shoe.  He also rescheduled his follow-up with me following our last visit in December and missed his appointment with Dr. Blenda Mounts.  The wound has not been evaluated in over 2 months at this point.  I discussed with him that I do not think that there is anyone to blame for the progression of the wound but this is the nature of diabetic foot ulcerations when the progress become infected and adherence to proper offloading and therapy is not undertaken.  I also encouraged him that we think of the best way to move forward and take care of this acutely worsened infection.  I think managing this further as an outpatient is likely to lead to further decompensation of the wound and likely amputation.  I have recommended admission to the hospital through the emergency room, he will need intravenous antibiotics, noninvasive vascular testing such as an ABI, an MRI of the left forefoot, infectious disease consult,  and likely surgical intervention by our service while in house.  He wished for second opinion today and I had him meet in consultation with my partner Dr. Celesta Gentile today in the office as well.  Dr. Jacqualyn Posey occurs with the diagnosis and plan.  I also discussed with him if he would prefer a third opinion at [place] or [facility] that would be possible as well but I would not delay this any further the next 24 hours.  He states he will think about this soon as possible and proceed to the emergency room when  he is ready.  I advised him to continue the doxycycline until he is at the emergency room, I also did a wound culture of the drainage today and will change his antibiotics accordingly and this will help guide therapy while inpatient as well. ? ?No follow-ups on file.  ? ? ?

## 2021-06-17 NOTE — ED Provider Triage Note (Signed)
Emergency Medicine Provider Triage Evaluation Note ? ?Ernest Edwards , a 61 y.o. male  was evaluated in triage.  Pt sent from [facility] for poorly healing wound on left foot.  Currently on Doxycyline.  Had it re-bandaged today before coming.  Area is painful.  Hx of little toe amputation on same foot.  Hx Diabetes. ? ?Review of Systems  ?Positive: Left foot wound ?Negative: Fever ? ?Physical Exam  ?BP (!) 166/102 (BP Location: Right Arm)   Pulse (!) 107   Temp 98.8 ?F (37.1 ?C) (Oral)   Resp 18   SpO2 98%  ?Gen:   Awake, no distress   ?Resp:  Normal effort  ?MSK:   Moves extremities without difficulty  ?Other:  Wound bandaged on medial/sole of left foot; absent little toe of left foot ? ?Medical Decision Making  ?Medically screening exam initiated at 5:42 PM.  Appropriate orders placed.  Ernest Bumpers Arndt was informed that the remainder of the evaluation will be completed by another provider, this initial triage assessment does not replace that evaluation, and the importance of remaining in the ED until their evaluation is complete. ? ?Labs ordered.  Imaging done today at [facility] office. ?  ?Cecil Cobbs, PA-C ?06/17/21 1750 ? ?

## 2021-06-17 NOTE — ED Triage Notes (Signed)
Pt from [facility] for eval of L foot wound getting progressively worse x 1 year. Being followed routinely by that practice and is currently on abx for same. Had XR done today.  ?

## 2021-06-17 NOTE — ED Provider Notes (Signed)
?[facility] EMERGENCY DEPARTMENT ?Provider Note ? ?CSN: [id number] ?Arrival date & time: 06/17/21 1717 ? ?Chief Complaint(s) ?Wound Check ? ?HPI ?Ernest Edwards is a 61 y.o. male with a past medical history listed below including hypertension, hyperlipidemia here from [facility].  Patient has been dealing with diabetic foot wound and currently on doxycycline.  Had a clinic visit today and work-up there was concerning for possible osteomyelitis of the first MTP joint.  Sent here for admission. ? ?The history is provided by the patient and medical records.  ? ?Past Medical History ?Past Medical History:  ?Diagnosis Date  ? Diabetes mellitus without complication (HCC)   ? Hypertension   ? ?Patient Active Problem List  ? Diagnosis Date Noted  ? Ulcer of foot (HCC) 08/26/2020  ? Persistent proteinuria associated with type 2 diabetes mellitus (HCC) 08/23/2019  ? ED (erectile dysfunction) 08/21/2019  ? Deformity of left foot 08/16/2019  ? History of amputation of lesser toe, left (HCC) 08/16/2019  ? Type 2 diabetes mellitus with circulatory disorder, with long-term current use of insulin (HCC) 08/18/2016  ? Back pain 03/12/2014  ? Family history of premature CAD 03/12/2014  ? Hypertension associated with diabetes (HCC) 03/12/2014  ? ?Home Medication(s) ?Prior to Admission medications   ?Medication Sig Start Date End Date Taking? Authorizing Provider  ?amLODipine (NORVASC) 10 MG tablet Take 10 mg by mouth daily. 06/13/21  Yes [provider]  ?atorvastatin (LIPITOR) 80 MG tablet Take 80 mg by mouth daily. 05/07/21  Yes [provider]  ?chlorthalidone (HYGROTON) 25 MG tablet Take 1 tablet (25 mg total) by mouth daily. 11/16/20  Yes Wieters, Hallie C, PA-C  ?insulin glargine (LANTUS) 100 UNIT/ML Solostar Pen Inject 40 Units into the skin at bedtime. 11/17/20 06/17/21 Yes Wieters, Hallie C, PA-C  ?metFORMIN (GLUCOPHAGE-XR) 500 MG 24 hr tablet Take 4 tablets (2,000 mg total) by  mouth daily with breakfast. 11/17/20 06/17/21 Yes Wieters, Hallie C, PA-C  ?sildenafil (VIAGRA) 100 MG tablet Take 100 mg by mouth daily as needed. 04/06/21  Yes [provider]  ?valsartan (DIOVAN) 80 MG tablet Take 80 mg by mouth daily.   Yes [provider]  ?amLODipine (NORVASC) 5 MG tablet Take 1 tablet (5 mg total) by mouth daily. ?Patient not taking: Reported on 06/17/2021 11/16/20   Wieters, Fran Lowes C, PA-C  ?doxycycline (VIBRAMYCIN) 100 MG capsule Take 1 capsule (100 mg total) by mouth 2 (two) times daily. ?Patient not taking: Reported on 06/17/2021 06/10/21   Edwin Cap, DPM  ?mupirocin ointment (BACTROBAN) 2 % Apply 1 application topically daily. 03/04/21   McDonald, Rachelle Hora, DPM  ?valsartan (DIOVAN) 160 MG tablet Take 320 mg by mouth daily. 06/07/21   [provider]  ?hydrochlorothiazide (MICROZIDE) 12.5 MG capsule Take 1 capsule (12.5 mg total) by mouth daily. 02/21/14 05/20/19  [name], PA-C  ?lisinopril (ZESTRIL) 10 MG tablet Take 1 tablet (10 mg total) by mouth daily. 05/20/19 05/20/19  [name], NP  ?                                                                                                                                  ?  Allergies ?Patient has no known allergies. ? ?Review of Systems ?Review of Systems ?As noted in HPI ? ?Physical Exam ?Vital Signs  ?I have reviewed the triage vital signs ?BP (!) 147/93 (BP Location: Right Arm)   Pulse 88   Temp 98.8 ?F (37.1 ?C) (Oral)   Resp (!) 23   SpO2 97%  ? ?Physical Exam ?Vitals reviewed.  ?Constitutional:   ?   General: He is not in acute distress. ?   Appearance: He is well-developed. He is not diaphoretic.  ?HENT:  ?   Head: Normocephalic and atraumatic.  ?   Right Ear: External ear normal.  ?   Left Ear: External ear normal.  ?   Nose: Nose normal.  ?   Mouth/Throat:  ?   Mouth: Mucous membranes are moist.  ?Eyes:  ?   General: No scleral icterus. ?   Conjunctiva/sclera: Conjunctivae normal.  ?Neck:  ?   Trachea:  Phonation normal.  ?Cardiovascular:  ?   Rate and Rhythm: Normal rate and regular rhythm.  ?Pulmonary:  ?   Effort: Pulmonary effort is normal. No respiratory distress.  ?   Breath sounds: No stridor.  ?Abdominal:  ?   General: There is no distension.  ?Musculoskeletal:     ?   General: Normal range of motion.  ?   Cervical back: Normal range of motion.  ?   Left lower leg: Swelling present.  ?   Left foot: Swelling present. No tenderness. Normal pulse.  ?Feet:  ?   Right foot:  ?   Skin integrity: Ulcer and skin breakdown present.  ?Neurological:  ?   Mental Status: He is alert and oriented to person, place, and time.  ?Psychiatric:     ?   Behavior: Behavior normal.  ? ? ? ?ED Results and Treatments ?Labs ?(all labs ordered are listed, but only abnormal results are displayed) ?Labs Reviewed  ?BASIC METABOLIC PANEL - Abnormal; Notable for the following components:  ?    Result Value  ? Sodium 132 (*)   ? Glucose, Bld 164 (*)   ? BUN 27 (*)   ? Creatinine, Ser 1.35 (*)   ? All other components within normal limits  ?CBC WITH DIFFERENTIAL/PLATELET - Abnormal; Notable for the following components:  ? Hemoglobin 11.1 (*)   ? HCT 34.5 (*)   ? MCH 25.9 (*)   ? Platelets 441 (*)   ? All other components within normal limits  ?RESP PANEL BY RT-PCR (FLU A&B, COVID) ARPGX2  ?                                                                                                                       ?EKG ? EKG Interpretation ? ?Date/Time:    ?Ventricular Rate:    ?PR Interval:    ?QRS Duration:   ?QT Interval:    ?QTC Calculation:   ?R Axis:     ?Text Interpretation:   ?  ? ?  ? ?Radiology ?  No results found. ? ?Pertinent labs & imaging results that were available during my care of the patient were reviewed by me and considered in my medical decision making (see MDM for details). ? ?Medications Ordered in ED ?Medications  ?sodium chloride 0.9 % bolus 1,000 mL (has no administration in time range)  ?  Followed by  ?0.9 %  sodium  chloride infusion (has no administration in time range)  ?                                                               ?                                                                    ?Procedures ?Procedures ? ?(including critical care time) ? ?Medical Decision Making / ED Course ? ? ? Complexity of Problem: ? ?Co morbidities that complicate the patient evaluation ?As noted in HPI ? ?Additional history obtained: ?Clinic noted:"he will need intravenous antibiotics, noninvasive vascular testing such as an ABI, an MRI of the left forefoot, infectious disease consult, and likely surgical intervention by our service while in house."  ? ?Patient's presenting problem/concern and DDX listed below: ?Foot wound ?Diabetic foot wound with concern for osteomyelitis. Will get labs to rule out septic status. ? ? ?  Complexity of Data: ?  ?Cardiac Monitoring: ?none ? ?Laboratory Tests ordered listed below with my independent interpretation: ?Cbc w/o leukocytosis ?BMP with mild AKI. Hyperglycemia w/o DKA ?  ?Imaging Studies ordered listed below with my independent interpretation: ?N/a    ?  ?ED Course:   ? ?Hospitalization Considered:  ?yes ? ?Assessment, Intervention, and Reassessment: ?Diabetic foot wound with concern for osteomyelitis. ?Holding Abx at this time ?Consulted medicine who agreed to admit  ? ? ?Final Clinical Impression(s) / ED Diagnoses ?Final diagnoses:  ?Diabetic ulcer of left midfoot associated with type 2 diabetes mellitus, with necrosis of bone (HCC)  ? ? ?  ? ? ? ? ? ?This chart was dictated using voice recognition software.  Despite best efforts to proofread,  errors can occur which can change the documentation meaning. ? ?  ?Nira Conn, MD ?06/17/21 2357 ? ?

## 2021-06-18 ENCOUNTER — Inpatient Hospital Stay (HOSPITAL_COMMUNITY): Payer: BC Managed Care – PPO

## 2021-06-18 ENCOUNTER — Encounter (HOSPITAL_COMMUNITY): Payer: Self-pay | Admitting: Family Medicine

## 2021-06-18 DIAGNOSIS — E11628 Type 2 diabetes mellitus with other skin complications: Secondary | ICD-10-CM | POA: Diagnosis present

## 2021-06-18 DIAGNOSIS — E1165 Type 2 diabetes mellitus with hyperglycemia: Secondary | ICD-10-CM | POA: Diagnosis present

## 2021-06-18 DIAGNOSIS — E66812 Obesity, class 2: Secondary | ICD-10-CM

## 2021-06-18 DIAGNOSIS — E785 Hyperlipidemia, unspecified: Secondary | ICD-10-CM | POA: Diagnosis present

## 2021-06-18 DIAGNOSIS — Z794 Long term (current) use of insulin: Secondary | ICD-10-CM | POA: Diagnosis not present

## 2021-06-18 DIAGNOSIS — Z8249 Family history of ischemic heart disease and other diseases of the circulatory system: Secondary | ICD-10-CM | POA: Diagnosis not present

## 2021-06-18 DIAGNOSIS — L97509 Non-pressure chronic ulcer of other part of unspecified foot with unspecified severity: Secondary | ICD-10-CM | POA: Diagnosis present

## 2021-06-18 DIAGNOSIS — L97424 Non-pressure chronic ulcer of left heel and midfoot with necrosis of bone: Secondary | ICD-10-CM | POA: Diagnosis present

## 2021-06-18 DIAGNOSIS — I10 Essential (primary) hypertension: Secondary | ICD-10-CM

## 2021-06-18 DIAGNOSIS — E871 Hypo-osmolality and hyponatremia: Secondary | ICD-10-CM | POA: Diagnosis present

## 2021-06-18 DIAGNOSIS — M86172 Other acute osteomyelitis, left ankle and foot: Secondary | ICD-10-CM | POA: Diagnosis not present

## 2021-06-18 DIAGNOSIS — N179 Acute kidney failure, unspecified: Secondary | ICD-10-CM | POA: Diagnosis present

## 2021-06-18 DIAGNOSIS — D75839 Thrombocytosis, unspecified: Secondary | ICD-10-CM | POA: Diagnosis present

## 2021-06-18 DIAGNOSIS — L0390 Cellulitis, unspecified: Secondary | ICD-10-CM

## 2021-06-18 DIAGNOSIS — Z20822 Contact with and (suspected) exposure to covid-19: Secondary | ICD-10-CM | POA: Diagnosis present

## 2021-06-18 DIAGNOSIS — E1169 Type 2 diabetes mellitus with other specified complication: Principal | ICD-10-CM

## 2021-06-18 DIAGNOSIS — Z91199 Patient's noncompliance with other medical treatment and regimen due to unspecified reason: Secondary | ICD-10-CM | POA: Diagnosis not present

## 2021-06-18 DIAGNOSIS — Z6836 Body mass index (BMI) 36.0-36.9, adult: Secondary | ICD-10-CM | POA: Diagnosis not present

## 2021-06-18 DIAGNOSIS — E669 Obesity, unspecified: Secondary | ICD-10-CM

## 2021-06-18 DIAGNOSIS — M86672 Other chronic osteomyelitis, left ankle and foot: Secondary | ICD-10-CM

## 2021-06-18 DIAGNOSIS — L02612 Cutaneous abscess of left foot: Secondary | ICD-10-CM | POA: Diagnosis present

## 2021-06-18 DIAGNOSIS — M869 Osteomyelitis, unspecified: Secondary | ICD-10-CM

## 2021-06-18 DIAGNOSIS — E11621 Type 2 diabetes mellitus with foot ulcer: Secondary | ICD-10-CM | POA: Diagnosis present

## 2021-06-18 DIAGNOSIS — L97529 Non-pressure chronic ulcer of other part of left foot with unspecified severity: Secondary | ICD-10-CM

## 2021-06-18 DIAGNOSIS — Z833 Family history of diabetes mellitus: Secondary | ICD-10-CM | POA: Diagnosis not present

## 2021-06-18 DIAGNOSIS — D638 Anemia in other chronic diseases classified elsewhere: Secondary | ICD-10-CM | POA: Diagnosis present

## 2021-06-18 DIAGNOSIS — Z79899 Other long term (current) drug therapy: Secondary | ICD-10-CM | POA: Diagnosis not present

## 2021-06-18 DIAGNOSIS — E861 Hypovolemia: Secondary | ICD-10-CM | POA: Diagnosis present

## 2021-06-18 LAB — PROTIME-INR
INR: 1.3 — ABNORMAL HIGH (ref 0.8–1.2)
Prothrombin Time: 15.7 seconds — ABNORMAL HIGH (ref 11.4–15.2)

## 2021-06-18 LAB — PROCALCITONIN: Procalcitonin: <0.1 ng/mL

## 2021-06-18 LAB — CBC
HCT: 31.1 % — ABNORMAL LOW (ref 39.0–52.0)
Hemoglobin: 10.2 g/dL — ABNORMAL LOW (ref 13.0–17.0)
MCH: 26.6 pg (ref 26.0–34.0)
MCHC: 32.8 g/dL (ref 30.0–36.0)
MCV: 81 fL (ref 80.0–100.0)
Platelets: 386 10*3/uL (ref 150–400)
RBC: 3.84 MIL/uL — ABNORMAL LOW (ref 4.22–5.81)
RDW: 13.4 % (ref 11.5–15.5)
WBC: 6.5 10*3/uL (ref 4.0–10.5)
nRBC: 0 % (ref 0.0–0.2)

## 2021-06-18 LAB — HIV ANTIBODY (ROUTINE TESTING W REFLEX): HIV Screen 4th Generation wRfx: NONREACTIVE

## 2021-06-18 LAB — BASIC METABOLIC PANEL
Anion gap: 11 (ref 5–15)
BUN: 20 mg/dL (ref 6–20)
CO2: 26 mmol/L (ref 22–32)
Calcium: 9 mg/dL (ref 8.9–10.3)
Chloride: 101 mmol/L (ref 98–111)
Creatinine, Ser: 1.19 mg/dL (ref 0.61–1.24)
GFR, Estimated: >60 mL/min (ref >60)
Glucose, Bld: 172 mg/dL — ABNORMAL HIGH (ref 70–99)
Potassium: 3.7 mmol/L (ref 3.5–5.1)
Sodium: 138 mmol/L (ref 135–145)

## 2021-06-18 LAB — SEDIMENTATION RATE: Sed Rate: 66 mm/hr — ABNORMAL HIGH (ref 0–16)

## 2021-06-18 LAB — RESP PANEL BY RT-PCR (FLU A&B, COVID) ARPGX2
Influenza A by PCR: NEGATIVE
Influenza B by PCR: NEGATIVE
SARS Coronavirus 2 by RT PCR: NEGATIVE

## 2021-06-18 LAB — CBG MONITORING, ED
Glucose-Capillary: 251 mg/dL — ABNORMAL HIGH (ref 70–99)
Glucose-Capillary: 184 mg/dL — ABNORMAL HIGH (ref 70–99)

## 2021-06-18 LAB — GLUCOSE, CAPILLARY: Glucose-Capillary: 222 mg/dL — ABNORMAL HIGH (ref 70–99)

## 2021-06-18 LAB — SURGICAL PCR SCREEN
MRSA, PCR: NEGATIVE
Staphylococcus aureus: POSITIVE — AB

## 2021-06-18 LAB — C-REACTIVE PROTEIN: CRP: 10.2 mg/dL — ABNORMAL HIGH (ref <1.0)

## 2021-06-18 LAB — CORTISOL-AM, BLOOD: Cortisol - AM: 16.4 ug/dL (ref 6.7–22.6)

## 2021-06-18 MED ORDER — INSULIN GLARGINE 100 UNIT/ML SOLOSTAR PEN
40.0000 [IU] | PEN_INJECTOR | Freq: Every day | SUBCUTANEOUS | Status: DC
Start: 2021-06-18 — End: 2021-06-18

## 2021-06-18 MED ORDER — METRONIDAZOLE 500 MG/100ML IV SOLN
500.0000 mg | Freq: Two times a day (BID) | INTRAVENOUS | Status: DC
  Administered 2021-06-18: 500 mg via INTRAVENOUS
  Filled 2021-06-18: qty 100

## 2021-06-18 MED ORDER — ACETAMINOPHEN 325 MG PO TABS
650.0000 mg | ORAL_TABLET | Freq: Four times a day (QID) | ORAL | Status: DC | PRN
  Administered 2021-06-20 – 2021-06-21 (×3): 650 mg via ORAL
  Filled 2021-06-18 (×4): qty 2

## 2021-06-18 MED ORDER — VANCOMYCIN HCL 2000 MG/400ML IV SOLN
2000.0000 mg | Freq: Once | INTRAVENOUS | Status: AC
  Administered 2021-06-18: 2000 mg via INTRAVENOUS
  Filled 2021-06-18: qty 400

## 2021-06-18 MED ORDER — SODIUM CHLORIDE 0.9 % IV SOLN
2.0000 g | Freq: Once | INTRAVENOUS | Status: AC
  Administered 2021-06-18: 2 g via INTRAVENOUS
  Filled 2021-06-18: qty 2

## 2021-06-18 MED ORDER — MORPHINE SULFATE (PF) 2 MG/ML IV SOLN: 2.0000 mg | INTRAVENOUS | Status: DC | PRN

## 2021-06-18 MED ORDER — CEFTRIAXONE SODIUM 2 G IJ SOLR
2.0000 g | INTRAMUSCULAR | Status: DC
  Administered 2021-06-18 – 2021-06-20 (×3): 2 g via INTRAVENOUS
  Filled 2021-06-18 (×3): qty 20

## 2021-06-18 MED ORDER — TRAZODONE HCL 50 MG PO TABS: 25.0000 mg | ORAL_TABLET | Freq: Every evening | ORAL | Status: DC | PRN

## 2021-06-18 MED ORDER — SODIUM CHLORIDE 0.9 % IV SOLN: INTRAVENOUS | Status: DC

## 2021-06-18 MED ORDER — INSULIN GLARGINE-YFGN 100 UNIT/ML ~~LOC~~ SOLN
40.0000 [IU] | Freq: Every day | SUBCUTANEOUS | Status: DC
  Administered 2021-06-18 – 2021-06-21 (×5): 40 [IU] via SUBCUTANEOUS
  Filled 2021-06-18 (×8): qty 0.4

## 2021-06-18 MED ORDER — ENOXAPARIN SODIUM 40 MG/0.4ML IJ SOSY
40.0000 mg | PREFILLED_SYRINGE | INTRAMUSCULAR | Status: DC
  Administered 2021-06-18 – 2021-06-22 (×5): 40 mg via SUBCUTANEOUS
  Filled 2021-06-18 (×5): qty 0.4

## 2021-06-18 MED ORDER — ACETAMINOPHEN 650 MG RE SUPP: 650.0000 mg | Freq: Four times a day (QID) | RECTAL | Status: DC | PRN

## 2021-06-18 MED ORDER — VANCOMYCIN HCL IN DEXTROSE 1-5 GM/200ML-% IV SOLN: 1000.0000 mg | Freq: Once | INTRAVENOUS | Status: DC

## 2021-06-18 MED ORDER — MAGNESIUM HYDROXIDE 400 MG/5ML PO SUSP
30.0000 mL | Freq: Every day | ORAL | Status: DC | PRN
  Filled 2021-06-18: qty 30

## 2021-06-18 MED ORDER — AMLODIPINE BESYLATE 10 MG PO TABS
10.0000 mg | ORAL_TABLET | Freq: Every day | ORAL | Status: DC
  Administered 2021-06-18 – 2021-06-22 (×5): 10 mg via ORAL
  Filled 2021-06-18 (×4): qty 1
  Filled 2021-06-18: qty 2

## 2021-06-18 MED ORDER — GADOBUTROL 1 MMOL/ML IV SOLN
10.0000 mL | Freq: Once | INTRAVENOUS | Status: AC | PRN
  Administered 2021-06-18: 10 mL via INTRAVENOUS

## 2021-06-18 MED ORDER — ONDANSETRON HCL 4 MG/2ML IJ SOLN: 4.0000 mg | Freq: Four times a day (QID) | INTRAMUSCULAR | Status: DC | PRN

## 2021-06-18 MED ORDER — ONDANSETRON HCL 4 MG PO TABS: 4.0000 mg | ORAL_TABLET | Freq: Four times a day (QID) | ORAL | Status: DC | PRN

## 2021-06-18 MED ORDER — DAPTOMYCIN 500 MG IV SOLR
8.0000 mg/kg | Freq: Every day | INTRAVENOUS | Status: DC
  Administered 2021-06-18 – 2021-06-19 (×2): 800 mg via INTRAVENOUS
  Filled 2021-06-18 (×3): qty 16

## 2021-06-18 MED ORDER — ATORVASTATIN CALCIUM 80 MG PO TABS
80.0000 mg | ORAL_TABLET | Freq: Every day | ORAL | Status: DC
  Administered 2021-06-18 – 2021-06-22 (×5): 80 mg via ORAL
  Filled 2021-06-18: qty 1
  Filled 2021-06-18: qty 2
  Filled 2021-06-18 (×3): qty 1

## 2021-06-18 NOTE — ED Notes (Signed)
Messaged IP RN for updated purple man to move patient. ?

## 2021-06-18 NOTE — Assessment & Plan Note (Signed)
-   The patient will be placed on supplement coverage with NovoLog. ?- We will continue his basal coverage. ?- We will hold off his Glucophage Exar. ?

## 2021-06-18 NOTE — Assessment & Plan Note (Signed)
-   We will continue statin therapy. 

## 2021-06-18 NOTE — Consult Note (Signed)
[facility]                                                                                        Patient Identification: Patient Name: Ernest Edwards MRN: [id number] [place] Date: 06/17/2021  5:21 PM Today's Date: 06/18/2021 Reason for consult: osteomyelitis  Requesting provider: Marzetta Board  Principal Problem:   Toe osteomyelitis, left ([place]) Active Problems:   Diabetic foot ulcer ([place])   Uncontrolled type 2 diabetes mellitus with hyperglycemia, with long-term current use of insulin ([place])   Essential hypertension   AKI (acute kidney injury) ([place])   Dyslipidemia   Antibiotics: Vancomycin 3/2                    Cefepime 3/2                    Metronidazole 3/2  Lines/Hardware: PIV  Assessment # 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 He is not in sepsis and recommend to hold off on abtx in anticipation he will be going to OR for surgical intervention/getting cultures  Fu MRI/ABI and podiatry plans  Fu cultures from 3/2 taken at [place] office Baseline ESR and CRP OK to start broad spectrum abtx ( daptomycin+ cefepime and metronidazole) post OR -> to be adjusted based on cultures   Dr Tommy Medal is on call this weekend and follow up on pending studies and cultures   Rest of the management as per the primary team. Please call with questions or concerns.  Thank you for the consult  Rosiland Oz, MD Infectious Disease Physician [facility] [address] [place], [place] [address] Phone: [phone]   Fax: [phone]  __________________________________________________________________________________________________________ HPI and Hospital  Course: 62 year old male with PMH of HTN, HLD, DM and chronic left foot ulcer sent from podiatry office on 3/2 for worsening left diabetic foot infection/osteomyelitis. Says left foot ulcer has been present for at least a year and was following with Podiatry however worsened on follow up visit with podiatry on 3/2. Denies any pain/swelling/redness and discharge. Denies fevers, chills and sweats. Denies nausea, vomiting and diarrhea. Was taking doxycycline for 5 days prior to admit. 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   At ED, afebrile AKI with creatinine of 1.35, platelets 441 MRI left foot/ABI is pending Podiatry consult is also pending   ROS: General- Denies fever, chills, loss of appetite and loss of weight HEENT - Denies headache, blurry vision, neck pain, sinus pain Chest - Denies any chest pain, SOB or cough CVS- Denies any dizziness/lightheadedness, syncopal attacks, palpitations Abdomen- Denies any nausea, vomiting, abdominal pain, hematochezia and diarrhea Neuro - Denies any weakness, numbness, tingling sensation Psych - Denies any changes in mood irritability or depressive symptoms GU- Denies any burning, dysuria, hematuria or increased frequency of urination Skin - denies any rashes/lesions MSK - denies any joint pain/swelling or restricted ROM  Past Medical History:  Diagnosis Date   Diabetes mellitus without complication ([place])    Hypertension    No past surgical history on file.  Scheduled Meds:  amLODipine  10 mg Oral Daily   atorvastatin  80 mg Oral Daily   enoxaparin (LOVENOX) injection  40 mg Subcutaneous Q24H   insulin glargine-yfgn  40 Units Subcutaneous QHS   Continuous Infusions:  sodium chloride     sodium chloride 100 mL/hr at 06/18/21 0116   metronidazole Stopped (06/18/21 0220)   PRN Meds:.acetaminophen **OR** acetaminophen, magnesium hydroxide, morphine injection, ondansetron **OR** ondansetron (ZOFRAN) IV, traZODone  No  Known Allergies  Social History   Socioeconomic History   Marital status: Divorced    Spouse name: Not on file   Number of children: Not on file   Years of education: Not on file   Highest education level: Not on file  Occupational History   Not on file  Tobacco Use   Smoking status: Never   Smokeless tobacco: Never  Vaping Use   Vaping Use: Never used  Substance and Sexual Activity   Alcohol use: No   Drug use: No   Sexual activity: Not on file  Other Topics Concern   Not on file  Social History Narrative   Not on file   Social Determinants of Health   Financial Resource Strain: Not on file  Food Insecurity: Not on file  Transportation Needs: Not on file  Physical Activity: Not on file  Stress: Not on file  Social Connections: Not on file  Intimate Partner Violence: Not on file   Breast Cancer-relatedfamily history is not on file.  Vitals BP (!) 154/84 (BP Location: Left Arm)    Pulse 86    Temp 98.2 F (36.8 C) (Oral)    Resp 18    Ht 6' 2"  (1.88 m)    Wt 129.3 kg Comment: estimated weight from 04/06/21 chart note   SpO2 96%    BMI 36.59 kg/m    Physical Exam Constitutional:  appears comfortable, sitting up in the bed     Comments:   Cardiovascular:     Rate and Rhythm: Normal rate and regular rhythm.     Heart sounds:   Pulmonary:     Effort: Pulmonary effort is normal.     Comments: lung sounds clear  Abdominal:     Palpations: Abdomen is soft.     Tenderness: non distended   Musculoskeletal:        General: No swelling or tenderness.  Left foot     Skin:    Comments: as above   Neurological:     General: Grossly non focal, awake, alert and oriented   Psychiatric:        Mood and Affect: Mood normal.    Pertinent Microbiology Results for orders placed or performed during the hospital encounter of 06/17/21  Resp Panel by RT-PCR (Flu A&B, Covid) Nasopharyngeal Swab     Status: None   Collection Time: 06/17/21 11:36 PM   Specimen:  Nasopharyngeal Swab; Nasopharyngeal(NP) swabs in vial transport medium  Result Value Ref Range Status   SARS Coronavirus 2 by RT PCR NEGATIVE NEGATIVE Final    Comment: (NOTE) SARS-CoV-2 target nucleic acids are NOT DETECTED.  The SARS-CoV-2 RNA is generally detectable in upper respiratory specimens during the acute phase of infection. The lowest concentration of SARS-CoV-2 viral copies this assay can detect is 138 copies/mL. A negative result does not preclude SARS-Cov-2 infection and should not be  used as the sole basis for treatment or other patient management decisions. A negative result may occur with  improper specimen collection/handling, submission of specimen other than nasopharyngeal swab, presence of viral mutation(s) within the areas targeted by this assay, and inadequate number of viral copies(<138 copies/mL). A negative result must be combined with clinical observations, patient history, and epidemiological information. The expected result is Negative.  Fact Sheet for Patients:  EntrepreneurPulse.com.au  Fact Sheet for Healthcare Providers:  IncredibleEmployment.be  This test is no t yet approved or cleared by the [place] FDA and  has been authorized for detection and/or diagnosis of SARS-CoV-2 by FDA under an Emergency Use Authorization (EUA). This EUA will remain  in effect (meaning this test can be used) for the duration of the COVID-19 declaration under Section 564(b)(1) of the Act, 21 U.S.C.section 360bbb-3(b)(1), unless the authorization is terminated  or revoked sooner.       Influenza A by PCR NEGATIVE NEGATIVE Final   Influenza B by PCR NEGATIVE NEGATIVE Final    Comment: (NOTE) The Xpert Xpress SARS-CoV-2/FLU/RSV plus assay is intended as an aid in the diagnosis of influenza from Nasopharyngeal swab specimens and should not be used as a sole basis for treatment. Nasal washings and aspirates are unacceptable for  Xpert Xpress SARS-CoV-2/FLU/RSV testing.  Fact Sheet for Patients: EntrepreneurPulse.com.au  Fact Sheet for Healthcare Providers: IncredibleEmployment.be  This test is not yet approved or cleared by the [place] FDA and has been authorized for detection and/or diagnosis of SARS-CoV-2 by FDA under an Emergency Use Authorization (EUA). This EUA will remain in effect (meaning this test can be used) for the duration of the COVID-19 declaration under Section 564(b)(1) of the Act, 21 U.S.C. section 360bbb-3(b)(1), unless the authorization is terminated or revoked.  Performed at [facility], [place] [address], [place], [place] [address]     Pertinent Lab seen by me: CBC Latest Ref Rng & Units 06/18/2021 06/17/2021 02/26/2021  WBC 4.0 - 10.5 K/uL 6.5 7.8 5.7  Hemoglobin 13.0 - 17.0 g/dL 10.2(L) 11.1(L) 10.9(L)  Hematocrit 39.0 - 52.0 % 31.1(L) 34.5(L) 33.4(L)  Platelets 150 - 400 K/uL 386 441(H) 300   CMP Latest Ref Rng & Units 06/18/2021 06/17/2021 02/26/2021  Glucose 70 - 99 mg/dL 172(H) 164(H) 105(H)  BUN 6 - 20 mg/dL 20 27(H) 12  Creatinine 0.61 - 1.24 mg/dL 1.19 1.35(H) 1.17  Sodium 135 - 145 mmol/L 138 132(L) 140  Potassium 3.5 - 5.1 mmol/L 3.7 3.9 3.8  Chloride 98 - 111 mmol/L 101 98 104  CO2 22 - 32 mmol/L 26 22 27   Calcium 8.9 - 10.3 mg/dL 9.0 9.3 9.5  Total Protein 6.0 - 8.3 g/dL - - -  Total Bilirubin 0.3 - 1.2 mg/dL - - -  Alkaline Phos 39 - 117 U/L - - -  AST 0 - 37 U/L - - -  ALT 0 - 53 U/L - - -     Pertinent Imagings/Other Imagings Plain films and CT images have been personally visualized and interpreted; radiology reports have been reviewed. Decision making incorporated into the Impression / Recommendations.  No results found.  I spent more than 80 minutes for this patient encounter including review of prior medical records/discussing diagnostics and treatment plan with the patient/family/coordinate care with  primary/other specialits with greater than 50% of time in face to face encounter.   Electronically signed by:   Rosiland Oz, MD Infectious Disease Physician [facility]  Pager: [phone]

## 2021-06-18 NOTE — Consult Note (Addendum)
?Hospital Consult ? ? ? ?Reason for Consult: Left foot wound ?Requesting Physician: Podiatrist Dr. Allena Katz ?MRN #:  [id number] ? ?History of Present Illness: This is a 61 y.o. male with past medical history significant for insulin-dependent diabetes mellitus as well as hypertension.  He is being seen in consultation for evaluation of left plantar foot ulceration.  He believes the ulcer has worsened over the past week.  Work-up included ABIs demonstrating triphasic waveforms at the level of the ankle with preserved waveforms down to the great toe.  He denies any pain in the left foot.  He is on a statin daily.  He denies tobacco use.  Plans noted for debridement and possible first toe amputation this weekend. ? ?Past Medical History:  ?Diagnosis Date  ? Diabetes mellitus without complication (H[facility])   ? Hypertension   ? ? ?No past surgical history on file. ? ?No Known Allergies ? ?Prior to Admission medications   ?Medication Sig Start Date End Date Taking? Authorizing Provider  ?amLODipine (NORVASC) 10 MG tablet Take 10 mg by mouth daily. 06/13/21  Yes [provider]  ?atorvastatin (LIPITOR) 80 MG tablet Take 80 mg by mouth daily. 05/07/21  Yes [provider]  ?chlorthalidone (HYGROTON) 25 MG tablet Take 1 tablet (25 mg total) by mouth daily. 11/16/20  Yes Wieters, Hallie C, PA-C  ?insulin glargine (LANTUS) 100 UNIT/ML Solostar Pen Inject 40 Units into the skin at bedtime. 11/17/20 06/17/21 Yes Wieters, Hallie C, PA-C  ?metFORMIN (GLUCOPHAGE-XR) 500 MG 24 hr tablet Take 4 tablets (2,000 mg total) by mouth daily with breakfast. 11/17/20 06/17/21 Yes Wieters, Hallie C, PA-C  ?OVER THE COUNTER MEDICATION Take 1 capsule by mouth daily. CLA- body fat reduction   Yes [provider]  ?sildenafil (VIAGRA) 100 MG tablet Take 100 mg by mouth daily as needed for erectile dysfunction. 04/06/21  Yes [provider]  ?valsartan (DIOVAN) 160 MG tablet Take 320 mg by mouth daily. 06/07/21  Yes [provider]  ?amLODipine (NORVASC) 5 MG tablet Take 1 tablet (5 mg total) by mouth daily. ?Patient not taking: Reported on 06/17/2021 11/16/20   Wieters, Fran Lowes C, PA-C  ?doxycycline (VIBRAMYCIN) 100 MG capsule Take 1 capsule (100 mg total) by mouth 2 (two) times daily. ?Patient not taking: Reported on 06/17/2021 06/10/21   Edwin Cap, DPM  ?mupirocin ointment (BACTROBAN) 2 % Apply 1 application topically daily. ?Patient not taking: Reported on 06/17/2021 03/04/21   Edwin Cap, DPM  ?hydrochlorothiazide (MICROZIDE) 12.5 MG capsule Take 1 capsule (12.5 mg total) by mouth daily. 02/21/14 05/20/19  [name], PA-C  ?lisinopril (ZESTRIL) 10 MG tablet Take 1 tablet (10 mg total) by mouth daily. 05/20/19 05/20/19  [name], NP  ? ? ?Social History  ? ?Socioeconomic History  ? Marital status: Divorced  ?  Spouse name: Not on file  ? Number of children: Not on file  ? Years of education: Not on file  ? Highest education level: Not on file  ?Occupational History  ? Not on file  ?Tobacco Use  ? Smoking status: Never  ? Smokeless tobacco: Never  ?Vaping Use  ? Vaping Use: Never used  ?Substance and Sexual Activity  ? Alcohol use: No  ? Drug use: No  ? Sexual activity: Not on file  ?Other Topics Concern  ? Not on file  ?Social History Narrative  ? Not on file  ? ?Social Determinants of Health  ? ?Financial Resource Strain: Not on file  ?Food Insecurity:  Not on file  ?Transportation Needs: Not on file  ?Physical Activity: Not on file  ?Stress: Not on file  ?Social Connections: Not on file  ?Intimate Partner Violence: Not on file  ? ? ? ?Family History  ?Problem Relation Age of Onset  ? Hypertension Mother   ? Diabetes Mother   ? Diabetes Father   ? Hypertension Father   ? ? ?ROS: Otherwise negative unless mentioned in HPI ? ?Physical Examination ? ?Vitals:  ? 06/18/21 1315 06/18/21 1542  ?BP: (!) 137/93 134/85  ?Pulse: 81 86  ?Resp: 18 18  ?Temp: 98.2 ?F (36.8 ?C)   ?SpO2: 99% 98%  ? ?Body mass index is 36.59  kg/m?. ? ?General:  WDWN in NAD ?Gait: Not observed ?HENT: WNL, normocephalic ?Pulmonary: normal non-labored breathing, without Rales, rhonchi,  wheezing ?Cardiac: regular ?Abdomen:  soft, NT/ND, no masses ?Skin: without rashes ?Vascular Exam/Pulses: 2+ palpable left DP and PT pulse ?Extremities:  ? ?Musculoskeletal: no muscle wasting or atrophy  ?Neurologic: A&O X 3;  No focal weakness or paresthesias are detected; speech is fluent/normal ?Psychiatric:  The pt has Normal affect. ?Lymph:  Unremarkable ? ?CBC ?   ?Component Value Date/Time  ? WBC 6.5 06/18/2021 0455  ? RBC 3.84 (L) 06/18/2021 0455  ? HGB 10.2 (L) 06/18/2021 0455  ? HCT 31.1 (L) 06/18/2021 0455  ? PLT 386 06/18/2021 0455  ? MCV 81.0 06/18/2021 0455  ? MCH 26.6 06/18/2021 0455  ? MCHC 32.8 06/18/2021 0455  ? RDW 13.4 06/18/2021 0455  ? LYMPHSABS 1.6 06/17/2021 1757  ? MONOABS 0.7 06/17/2021 1757  ? EOSABS 0.2 06/17/2021 1757  ? BASOSABS 0.0 06/17/2021 1757  ? ? ?BMET ?   ?Component Value Date/Time  ? NA 138 06/18/2021 0455  ? K 3.7 06/18/2021 0455  ? CL 101 06/18/2021 0455  ? CO2 26 06/18/2021 0455  ? GLUCOSE 172 (H) 06/18/2021 0455  ? BUN 20 06/18/2021 0455  ? CREATININE 1.19 06/18/2021 0455  ? CALCIUM 9.0 06/18/2021 0455  ? GFRNONAA >60 06/18/2021 0455  ? GFRAA >90 03/05/2014 0832  ? ? ?COAGS: ?Lab Results  ?Component Value Date  ? INR 1.3 (H) 06/18/2021  ? ? ? ?Non-Invasive Vascular Imaging:   ?ABI/TBIToday's ABIToday's TBI     Previous ABIPrevious TBI  ?+-------+-----------+----------------+------------+------------+  ?Right  1.33       1.27                                      ?+-------+-----------+----------------+------------+------------+  ?Left   1.40       unable to obtain  ? ? ?ASSESSMENT/PLAN: This is a 61 y.o. male with plantar left foot ulceration ? ?-Left foot well-perfused on exam with 2+ palpable left DP and PT pulse ?-ABI waveforms are triphasic with preserved waveforms all the way down to the great toe ?-Given ABI  waveforms as well as physical exam I believe patient is optimized from a vascular surgery standpoint.  Okay to proceed with debridement versus first toe amputation ?-On-call vascular surgeon Dr. Karin Lieu will evaluate the patient later today and provide further treatment plans ? ?Emilie Rutter PA-C ?[facility] ?[phone] ? ? ?VASCULAR STAFF ADDENDUM: ?I have independently interviewed and examined the patient. ?I agree with the above.  ?Patient with diabetic foot infection, with myelitis at the left first metatarsal phalangeal joint. ?ABIs reviewed demonstrating medial calcinosis of his vessels due to longstanding diabetes.  Patient  with excellent toe pressure and preserved waveforms at the level of the ankle.  On physical exam, Ernest Edwards had a palpable pulse in his foot.  Bone was palpated in the wound bed. ? ?Based on noninvasive studies and physical exam, Ernest Edwards should have sufficient perfusion for wound healing.  Should poor wound healing occur of partial first ray amputation, I am happy to offer left lower extremity angiogram with possible intervention. ? ?J. Gillis Santa, MD ?[facility] ?Office Phone Number: [phone] ?06/18/2021 5:00 PM ? ? ?

## 2021-06-18 NOTE — Progress Notes (Signed)
Patient seen and examined this morning, admitted overnight, H&P reviewed and agree with assessment and plan ? ?61 year old male with history of DM2, HTN, HLD who comes into the hospital with a chronic left foot diabetic ulcer, nonhealing, with high suspicion for osteomyelitis.  He was seen in podiatry office yesterday, felt to have worsening of his wound and he was sent to the hospital for antibiotics, MRI, ABIs, ID consult.  Podiatry will follow. ? ?Principal problem ?Toe osteomyelitis-based on preliminary imaging and previously involved left big toe at the first MTP.  MRI of the toe was ordered this morning and is pending.  He is nontoxic-appearing, afebrile, no WBC, discussed with ID, hold antibiotics to increase culture yield if he will be taken to the OR.  Obtain ABIs as well ? ?Active problems ?Type 2 diabetes mellitus, uncontrolled, with hyperglycemia -continue insulin, sliding scale ? ?CBG (last 3)  ?Recent Labs  ?  06/18/21 ?0906  ?GLUCAP 251*  ? ?Acute kidney injury-resolved with fluids ? ?Essential hypertension-continue Norvasc, hold chlorthalidone ? ?Hyperlipidemia-continue statin ? ?Scheduled Meds: ? amLODipine  10 mg Oral Daily  ? atorvastatin  80 mg Oral Daily  ? enoxaparin (LOVENOX) injection  40 mg Subcutaneous Q24H  ? insulin glargine-yfgn  40 Units Subcutaneous QHS  ? ?Continuous Infusions: ? sodium chloride    ? sodium chloride 100 mL/hr at 06/18/21 0116  ? ?PRN Meds:.acetaminophen **OR** acetaminophen, magnesium hydroxide, morphine injection, ondansetron **OR** ondansetron (ZOFRAN) IV, traZODone ? ?Kylina Vultaggio M. Elvera Lennox, MD, PhD ?[facility] ? ?Between 7 am - 7 pm you can contact me via Amion (for emergencies) or Securechat (non urgent matters).  ?I am not available 7 pm - 7 am, please contact night coverage MD/APP via Amion ?

## 2021-06-18 NOTE — H&P (Addendum)
[place]   PATIENT NAME: Ernest Edwards    MR#:  [id number]  DATE OF BIRTH:  [birth year]  DATE OF ADMISSION:  06/17/2021  PRIMARY CARE PHYSICIAN: [facility]   Patient is coming from: Home.    REQUESTING/REFERRING PHYSICIAN: Cardama, Grayce Sessions, MD   CHIEF COMPLAINT:   Chief Complaint  Patient presents with   Wound Check    HISTORY OF PRESENT ILLNESS:  SENCERE VANNAME is a 61 y.o. African-American male with medical history significant for type 2 diabetes mellitus and hypertension and dyslipidemia, who presented to the emergency room with acute onset of suspected left foot diabetic ulcer infection with osteomyelitis.  The patient was seen today at the foot and ankle clinic.  He has been managed for diabetic foot wound and is currently taking 5 days of p.o. doxycycline.  Follow-up visit today was concerning for possible osteomyelitis of the first MTP joint by x-ray done at the office.  The patient denies any fever or chills.  No nausea or vomiting or abdominal pain.  No chest pain or dyspnea or palpitations.  No cough or wheezing or hemoptysis.  No bleeding diathesis.  He has been having elevated blood glucose levels above 200.  ED Course: Upon presentation to the emergency room, BP was 166/102 with a heart rate of 107 and later BP was 143/92.  His labs revealed mild hyponatremia of 132 and blood glucose of 164, BUN 27 and creatinine 1.35 up from 1.17 on 11 02/2021.  CBC showed anemia close to baseline and thrombocytosis of 441 likely reactive.  On influenza antigens and COVID-19 PCR to back negative. EKG as reviewed by me : Pending. Imaging: Office x-ray today revealed significant osteolysis of the medial sesamoid as well as the medial base of the proximal phalanx concerning for osteomyelitis.  The patient was given IV vancomycin and Zosyn.  He will be admitted to a medical-surgical bed for further evaluation and management. PAST MEDICAL HISTORY:   Past  Medical History:  Diagnosis Date   Diabetes mellitus without complication ([place])    Hypertension     PAST SURGICAL HISTORY:  Debridement of his left fifth toe.  SOCIAL HISTORY:   Social History   Tobacco Use   Smoking status: Never   Smokeless tobacco: Never  Substance Use Topics   Alcohol use: No    FAMILY HISTORY:   Family History  Problem Relation Age of Onset   Hypertension Mother    Diabetes Mother    Diabetes Father    Hypertension Father     DRUG ALLERGIES:  No Known Allergies  REVIEW OF SYSTEMS:   ROS As per history of present illness. All pertinent systems were reviewed above. Constitutional, HEENT, cardiovascular, respiratory, GI, GU, musculoskeletal, neuro, psychiatric, endocrine, integumentary and hematologic systems were reviewed and are otherwise negative/unremarkable except for positive findings mentioned above in the HPI.   MEDICATIONS AT HOME:   Prior to Admission medications   Medication Sig Start Date End Date Taking? Authorizing Provider  amLODipine (NORVASC) 10 MG tablet Take 10 mg by mouth daily. 06/13/21  Yes [provider]  atorvastatin (LIPITOR) 80 MG tablet Take 80 mg by mouth daily. 05/07/21  Yes [provider]  chlorthalidone (HYGROTON) 25 MG tablet Take 1 tablet (25 mg total) by mouth daily. 11/16/20  Yes Wieters, Hallie C, PA-C  insulin glargine (LANTUS) 100 UNIT/ML Solostar Pen Inject 40 Units into the skin at bedtime. 11/17/20 06/17/21 Yes Wieters, Hallie C, PA-C  metFORMIN (GLUCOPHAGE-XR) 500 MG 24 hr tablet Take 4 tablets (2,000 mg total) by mouth daily with breakfast. 11/17/20 06/17/21 Yes Wieters, Hallie C, PA-C  OVER THE COUNTER MEDICATION Take 1 capsule by mouth daily. CLA- body fat reduction   Yes [provider]  sildenafil (VIAGRA) 100 MG tablet Take 100 mg by mouth daily as needed for erectile dysfunction. 04/06/21  Yes [provider]  valsartan (DIOVAN) 160 MG tablet Take 320 mg by mouth  daily. 06/07/21  Yes [provider]  amLODipine (NORVASC) 5 MG tablet Take 1 tablet (5 mg total) by mouth daily. Patient not taking: Reported on 06/17/2021 11/16/20   Wieters, Madelynn Done C, PA-C  doxycycline (VIBRAMYCIN) 100 MG capsule Take 1 capsule (100 mg total) by mouth 2 (two) times daily. Patient not taking: Reported on 06/17/2021 06/10/21   Criselda Peaches, DPM  mupirocin ointment (BACTROBAN) 2 % Apply 1 application topically daily. Patient not taking: Reported on 06/17/2021 03/04/21   Criselda Peaches, DPM  hydrochlorothiazide (MICROZIDE) 12.5 MG capsule Take 1 capsule (12.5 mg total) by mouth daily. 02/21/14 05/20/19  Hyman Bible, PA-C  lisinopril (ZESTRIL) 10 MG tablet Take 1 tablet (10 mg total) by mouth daily. 05/20/19 05/20/19  Loura Halt A, NP      VITAL SIGNS:  Blood pressure (!) 145/84, pulse 88, temperature 98.7 F (37.1 C), temperature source Oral, resp. rate 18, height 6\' 2"  (1.88 m), weight 129.3 kg, SpO2 100 %.  PHYSICAL EXAMINATION:  Physical Exam  GENERAL:  61 y.o.-year-old Afro-American male patient lying in the bed with no acute distress.  EYES: Pupils equal, round, reactive to light and accommodation. No scleral icterus. Extraocular muscles intact.  HEENT: Head atraumatic, normocephalic. Oropharynx and nasopharynx clear.  NECK:  Supple, no jugular venous distention. No thyroid enlargement, no tenderness.  LUNGS: Normal breath sounds bilaterally, no wheezing, rales,rhonchi or crepitation. No use of accessory muscles of respiration.  CARDIOVASCULAR: Regular rate and rhythm, S1, S2 normal. No murmurs, rubs, or gallops.  ABDOMEN: Soft, nondistended, nontender. Bowel sounds present. No organomegaly or mass.  EXTREMITIES: No pedal edema, cyanosis, or clubbing.  NEUROLOGIC: Cranial nerves II through XII are intact. Muscle strength 5/5 in all extremities. Sensation intact. Gait not checked.  PSYCHIATRIC: The patient is alert and oriented x 3.  Normal affect and good eye  contact. SKIN: Left foot plantar ulcer with minimal purulent discharge whitish basal eschar and surrounding erythema and swelling.   LABORATORY PANEL:   CBC Recent Labs  Lab 06/17/21 1757  WBC 7.8  HGB 11.1*  HCT 34.5*  PLT 441*   ------------------------------------------------------------------------------------------------------------------  Chemistries  Recent Labs  Lab 06/17/21 1757  NA 132*  K 3.9  CL 98  CO2 22  GLUCOSE 164*  BUN 27*  CREATININE 1.35*  CALCIUM 9.3   ------------------------------------------------------------------------------------------------------------------  Cardiac Enzymes No results for input(s): TROPONINI in the last 168 hours. ------------------------------------------------------------------------------------------------------------------  RADIOLOGY:  No results found.    IMPRESSION AND PLAN:  Assessment and Plan: * Toe osteomyelitis, left (HCC) - This involves the left big toe at the first MTP. - The patient be admitted to a medical-surgical bed. - Pain management will be provided. - We will continue antibiotic therapy with IV cefepime, vancomycin and Flagyl. - Podiatry consult will be obtained. - The patient was seen by Dr. Sherryle Lis yesterday who sent him to the ER. - We will keep the patient n.p.o. for now after midnight. - ID consult can be called as needed depending on podiatry plan of management.  Diabetic foot ulcer ([place]) - Management as above. - He will likely need debridement of his left foot plantar diabetic ulcer.  Uncontrolled type 2 diabetes mellitus with hyperglycemia, with long-term current use of insulin ([place]) - The patient will be placed on supplement coverage with NovoLog. - We will continue his basal coverage. - We will hold off his Glucophage Exar.  AKI (acute kidney injury) ([place]) - This is likely prerenal due to hypovolemia. - He will be hydrated with IV normal saline and we will follow his BMP. -  We will hold off his Glucophage XR, chlorthalidone and briefly Diovan.  Essential hypertension - We will continue his Norvasc and place on as needed IV labetalol. - Chlorthalidone will be held off. - Diovan will be briefly held off pending improvement of his renal functions.  Dyslipidemia - We will continue statin therapy.       DVT prophylaxis: Lovenox. Advanced Care Planning:  Code Status: full code. Family Communication:  The plan of care was discussed in details with the patient (and family). I answered all questions. The patient agreed to proceed with the above mentioned plan. Further management will depend upon hospital course. Disposition Plan: Back to previous home environment Consults called: Podiatry.  [facility] All the records are reviewed and case discussed with ED provider.  Status is: Inpatient  At the time of the admission, it appears that the appropriate admission status for this patient is inpatient.  This is judged to be reasonable and necessary in order to provide the required intensity of service to ensure the patient's safety given the presenting symptoms, physical exam findings and initial radiographic and laboratory data in the context of comorbid conditions.  The patient requires inpatient status due to high intensity of service, high risk of further deterioration and high frequency of surveillance required.  I certify that at the time of admission, it is my clinical judgment that the patient will require inpatient hospital care extending more than 2 midnights.                            Dispo: The patient is from: Home              Anticipated d/c is to: Home              Patient currently is not medically stable to d/c.              Difficult to place patient: No  Christel Mormon M.D on 06/18/2021 at 3:31 AM  [facility]   From 7 PM-7 AM, contact night-coverage [email or web address]  CC: Primary care physician; [employer], [employer]

## 2021-06-18 NOTE — Assessment & Plan Note (Signed)
-   This is likely prerenal due to hypovolemia. ?- He will be hydrated with IV normal saline and we will follow his BMP. ?- We will hold off his Glucophage XR, chlorthalidone and briefly Diovan. ?

## 2021-06-18 NOTE — Assessment & Plan Note (Addendum)
-   This involves the left big toe at the first MTP. ?- The patient be admitted to a medical-surgical bed. ?- Pain management will be provided. ?- We will continue antibiotic therapy with IV cefepime, vancomycin and Flagyl. ?- Podiatry consult will be obtained. ?- The patient was seen by Dr. Sherryle Lis yesterday who sent him to the ER. ?- We will keep the patient n.p.o. for now after midnight. ?- ID consult can be called as needed depending on podiatry plan of management. ?

## 2021-06-18 NOTE — ED Notes (Signed)
Per phone call, receiving RN ready for patient. ?

## 2021-06-18 NOTE — Progress Notes (Signed)
ID Brief Note  ? ?MRI left foot findings noted  ? ?1. Large soft tissue ulcer at the plantar base of the first MTP ?joint communicating with the joint space. ?2. Underlying first MTP joint septic arthritis with osteomyelitis of ?the first proximal phalanx, first metatarsal, and hallux sesamoids. ?3. 1.5 x 0.8 x 1.4 cm abscess medial to the first metatarsal head. ?4. Infectious flexor hallucis longus tenosynovitis. ?  ?ABI prelim  ?Right: Resting right ankle-brachial index indicates noncompressible right  ?lower extremity arteries. The right toe-brachial index is normal.  ? ?Left: Resting left ankle-brachial index indicates noncompressible left  ?lower extremity arteries.  ? ?Podiatry recs noted, possible plan for partial 1st ray amputation on Sunday pending Vascular eval. Vascular consulted  ?He has a wound culture done in podiatry office on 3/2 ?Will start on daptomycin 8mg /kg and ceftriaxone 2 g iv daily with abscess noted in MRI left foot and no definite plan when he is going to OR.  ? ?Dr available this weekend as needed ? ?Daiva Eves, MD ?Infectious Disease Physician ?[facility] ?[address] ?[place], [place] ?Phone: [phone]  Fax: [phone] ? ?

## 2021-06-18 NOTE — Assessment & Plan Note (Signed)
-   Management as above. ?- He will likely need debridement of his left foot plantar diabetic ulcer. ?

## 2021-06-18 NOTE — Plan of Care (Signed)
  Problem: Education: Goal: Knowledge of General Education information will improve Description Including pain rating scale, medication(s)/side effects and non-pharmacologic comfort measures Outcome: Progressing   Problem: Health Behavior/Discharge Planning: Goal: Ability to manage health-related needs will improve Outcome: Progressing   

## 2021-06-18 NOTE — Progress Notes (Addendum)
Pharmacy Antibiotic Note ? ?Rhyland Hinderliter Digiulio is a 61 y.o. male admitted on 06/17/2021 with  code sepsis .  Pharmacy has been consulted for vancomycin and cefepime dosing. ? ?Plan: ?Vancomycin 2000 mg IV once. Goal AUC 400-600. Then, Vancomycin 1500 mg IV Q 12 hrs. Goal AUC 400-600. ?Expected AUC: 500 ?SCr used: 1.35  ?Cefepime 2g IV q8h. ? ?Temp (24hrs), Avg:98.8 ?F (37.1 ?C), Min:98.8 ?F (37.1 ?C), Max:98.8 ?F (37.1 ?C) ? ?Recent Labs  ?Lab 06/17/21 ?1757  ?WBC 7.8  ?CREATININE 1.35*  ?  ?CrCl cannot be calculated (Unknown ideal weight.).   ? ?No Known Allergies ? ?Thank you for allowing pharmacy to be a part of this patient?s care. ? ? ?

## 2021-06-18 NOTE — Consult Note (Addendum)
?  Subjective:  ?Patient ID: Ernest Edwards, male    DOB: 10-24-1960,  MRN: [id number] ? ?A 60 y.o. male medical history significant for type 2 diabetes mellitus and hypertension and dyslipidemia, who presented to the emergency room with acute onset of suspected left foot diabetic ulcer infection with osteomyelitis.  Patient has been known to her clinic however has been noncompliant with the treatment.  Now patient is concern for osteomyelitis.  He states he is feeling okay today.  He is here to get IV antibiotics and possible surgical indication. ?Objective:  ? ?Vitals:  ? 06/18/21 0956 06/18/21 1315  ?BP: 131/83 (!) 137/93  ?Pulse: 81 81  ?Resp: 18 18  ?Temp:  98.2 ?F (36.8 ?C)  ?SpO2: 98% 99%  ? ?General AA&O x3. Normal mood and affect.  ?Vascular Dorsalis pedis and posterior tibial pulses non bilat. ?Diminished capillary refill to all digits. Pedal hair not present.  ?Neurologic Epicritic sensation grossly intact.  ?Dermatologic full-thickness ulceration on the plantar first MTPJ worsened in size and characteristics now approximately 2 cm in diameter and circular.  Probes directly to bone joint and tendon.  Malodorous with serous drainage.  There is a sinus tract to the medial first MTPJ that is draining to the skin here  ?Orthopedic: MMT 5/5 in dorsiflexion, plantarflexion, inversion, and eversion. ?Normal joint ROM without pain or crepitus.  ? ? ?IMPRESSION: ?1. Large soft tissue ulcer at the plantar base of the first MTP ?joint communicating with the joint space. ?2. Underlying first MTP joint septic arthritis with osteomyelitis of ?the first proximal phalanx, first metatarsal, and hallux sesamoids. ?3. 1.5 x 0.8 x 1.4 cm abscess medial to the first metatarsal head. ?4. Infectious flexor hallucis longus tenosynovitis. ?Assessment & Plan:  ?Patient was evaluated and treated and all questions answered. ? ?Left first metatarsophalangeal joint osteomyelitis with involvement of the base of the proximal phalanx as  well as the metatarsal and the sesamoids ?-All questions and concerns were discussed with the patient in extensive detail ?-Continue IV antibiotics ?-Patient will need a surgical amputation during this admission.  Patient is amenable to having a partial first ray amputation however he will think about it. ?-He will also need vascular work-up as well given the patient's most recent ABIs PVRs shows no noncompressible arteries to both lower extremity ?-Local wound care with Betadine wet-to-dry dressing for now ?-I have preemptively placed him on your schedule on Sunday but we will reschedule it based on vascular input. ?-Partial weightbearing to the heel ?-Podiatry to continue to follow ? ?Candelaria Stagers, DPM ? ?Accessible via secure chat for questions or concerns. ? ?

## 2021-06-18 NOTE — Assessment & Plan Note (Addendum)
-   We will continue his Norvasc and place on as needed IV labetalol. ?- Chlorthalidone will be held off. ?- Diovan will be briefly held off pending improvement of his renal functions. ?

## 2021-06-18 NOTE — Progress Notes (Signed)
ABI has been completed.  ? ?Preliminary results in CV Proc.  ? ?Edee Nifong Ovie Eastep ?06/18/2021 11:27 AM    ?

## 2021-06-19 ENCOUNTER — Encounter (HOSPITAL_COMMUNITY): Payer: Self-pay | Admitting: Family Medicine

## 2021-06-19 DIAGNOSIS — L97529 Non-pressure chronic ulcer of other part of left foot with unspecified severity: Secondary | ICD-10-CM

## 2021-06-19 DIAGNOSIS — M86672 Other chronic osteomyelitis, left ankle and foot: Secondary | ICD-10-CM

## 2021-06-19 DIAGNOSIS — E11621 Type 2 diabetes mellitus with foot ulcer: Secondary | ICD-10-CM

## 2021-06-19 LAB — COMPREHENSIVE METABOLIC PANEL
ALT: 22 U/L (ref 0–44)
AST: 18 U/L (ref 15–41)
Albumin: 2.6 g/dL — ABNORMAL LOW (ref 3.5–5.0)
Alkaline Phosphatase: 53 U/L (ref 38–126)
Anion gap: 10 (ref 5–15)
BUN: 13 mg/dL (ref 6–20)
CO2: 24 mmol/L (ref 22–32)
Calcium: 8.8 mg/dL — ABNORMAL LOW (ref 8.9–10.3)
Chloride: 103 mmol/L (ref 98–111)
Creatinine, Ser: 1.08 mg/dL (ref 0.61–1.24)
GFR, Estimated: >60 mL/min (ref >60)
Glucose, Bld: 108 mg/dL — ABNORMAL HIGH (ref 70–99)
Potassium: 3.7 mmol/L (ref 3.5–5.1)
Sodium: 137 mmol/L (ref 135–145)
Total Bilirubin: 0.3 mg/dL (ref 0.3–1.2)
Total Protein: 6.9 g/dL (ref 6.5–8.1)

## 2021-06-19 LAB — CBC
HCT: 31.2 % — ABNORMAL LOW (ref 39.0–52.0)
Hemoglobin: 10.4 g/dL — ABNORMAL LOW (ref 13.0–17.0)
MCH: 26.5 pg (ref 26.0–34.0)
MCHC: 33.3 g/dL (ref 30.0–36.0)
MCV: 79.6 fL — ABNORMAL LOW (ref 80.0–100.0)
Platelets: 367 10*3/uL (ref 150–400)
RBC: 3.92 MIL/uL — ABNORMAL LOW (ref 4.22–5.81)
RDW: 13.5 % (ref 11.5–15.5)
WBC: 4.8 10*3/uL (ref 4.0–10.5)
nRBC: 0 % (ref 0.0–0.2)

## 2021-06-19 LAB — GLUCOSE, CAPILLARY
Glucose-Capillary: 101 mg/dL — ABNORMAL HIGH (ref 70–99)
Glucose-Capillary: 173 mg/dL — ABNORMAL HIGH (ref 70–99)
Glucose-Capillary: 119 mg/dL — ABNORMAL HIGH (ref 70–99)
Glucose-Capillary: 172 mg/dL — ABNORMAL HIGH (ref 70–99)

## 2021-06-19 LAB — CK: Total CK: 181 U/L (ref 49–397)

## 2021-06-19 MED ORDER — MUPIROCIN 2 % EX OINT
1.0000 "application " | TOPICAL_OINTMENT | Freq: Two times a day (BID) | CUTANEOUS | Status: DC
  Administered 2021-06-19 – 2021-06-22 (×7): 1 via NASAL
  Filled 2021-06-19 (×2): qty 22

## 2021-06-19 MED ORDER — CHLORHEXIDINE GLUCONATE CLOTH 2 % EX PADS
6.0000 | MEDICATED_PAD | Freq: Every day | CUTANEOUS | Status: DC
  Administered 2021-06-19 – 2021-06-22 (×4): 6 via TOPICAL

## 2021-06-19 NOTE — Progress Notes (Signed)
?  Subjective:  ?Patient ID: Ernest Edwards, male    DOB: [birth year],  MRN: [id number] ? ?A 61 y.o. male was seen at bedside.  Patient states he is doing well.  No nausea fever chills vomiting.  Minimal pain at the big toe joint.  He has been cleared by vascular surgery to undergo amputation as he has best optimize flow to the foot. ?Objective:  ? ?Vitals:  ? 06/19/21 0500 06/19/21 0807  ?BP: 138/86 134/85  ?Pulse: 78 74  ?Resp: 18 20  ?Temp: 98 ?F (36.7 ?C) 98 ?F (36.7 ?C)  ?SpO2: 100% 100%  ? ?General AA&O x3. Normal mood and affect.  ?Vascular Dorsalis pedis and posterior tibial pulses non bilat. ?Diminished capillary refill to all digits. Pedal hair not present.  ?Neurologic Epicritic sensation grossly intact.  ?Dermatologic full-thickness ulceration on the plantar first MTPJ worsened in size and characteristics now approximately 2 cm in diameter and circular.  Probes directly to bone joint and tendon.  Malodorous with serous drainage.  There is a sinus tract to the medial first MTPJ that is draining to the skin here  ?Orthopedic: MMT 5/5 in dorsiflexion, plantarflexion, inversion, and eversion. ?Normal joint ROM without pain or crepitus.  ? ? ?IMPRESSION: ?1. Large soft tissue ulcer at the plantar base of the first MTP ?joint communicating with the joint space. ?2. Underlying first MTP joint septic arthritis with osteomyelitis of ?the first proximal phalanx, first metatarsal, and hallux sesamoids. ?3. 1.5 x 0.8 x 1.4 cm abscess medial to the first metatarsal head. ?4. Infectious flexor hallucis longus tenosynovitis. ?Assessment & Plan:  ?Patient was evaluated and treated and all questions answered. ? ?Left first metatarsophalangeal joint osteomyelitis with involvement of the base of the proximal phalanx as well as the metatarsal and the sesamoids ?-All questions and concerns were discussed with the patient in extensive detail ?-Continue IV antibiotics ?-ABIs PVRs were reviewed with the patient and vascular  surgery was consulted.  He is cleared to undergo surgery by vascular.  He has the best flow possible. ?-Local wound care with Betadine wet-to-dry dressing for now ?-We will schedule him to the operating room for tomorrow for left partial first ray amputation with primary closure if possible. ?-N.p.o. after midnight ?-Local wound care with Betadine wet-to-dry dressing. ?-Podiatry to continue to follow ? ?Candelaria Stagers, DPM ? ?Accessible via secure chat for questions or concerns. ? ?

## 2021-06-19 NOTE — Progress Notes (Signed)
PROGRESS NOTE  Ernest BumpersWilliam A Edwards UJW:[id number] DOB: 11/22/1960 DOA: 06/17/2021 PCP: [facility]   LOS: 1 day   Brief Narrative / Interim history: 61 year old male with history of DM2, HTN, HLD who comes into the hospital with a chronic left foot diabetic ulcer, nonhealing, with high suspicion for osteomyelitis.  He was seen in podiatry office yesterday, felt to have worsening of his wound and he was sent to the hospital for antibiotics, MRI, ABIs, ID consult.  Podiatry will follow.  Subjective / 24h Interval events: Doing well this morning.  Just talked with Dr. Allena KatzPatel of podiatry, a little bit apprehensive about surgery but understands it is needed  Assesement and Plan: Principal Problem:   Toe osteomyelitis, left (HCC) Active Problems:   Diabetic foot ulcer (HCC)   Uncontrolled type 2 diabetes mellitus with hyperglycemia, with long-term current use of insulin (HCC)   AKI (acute kidney injury) (HCC)   Essential hypertension   Dyslipidemia   Obesity, Class II, BMI 35-39.9   Assessment and Plan: Principal problem Toe osteomyelitis-patient was admitted to the hospital from podiatry office.  Underwent an MRI of the left foot on admission on 3/3 which showed a large soft tissue ulcer at the plantar base of the first MTP, also septic arthritis with osteomyelitis of the first proximal phalanx, first metatarsal and hallux sesamoids, as well as a 1.5 x 0.8 x 1.4 abscess medial to the first metatarsal head.  Podiatry following, plans to take patient to the OR tomorrow for first toe amputation.  Discussed with Dr. Allena KatzPatel, this will plan to be curative.  ID following.  Vascular also consulted due to noncompressive bilateral lower extremity arteries, but given good pulses/waveforms no current interventions planned as he appears to have adequate blood flow for healing   Active problems Type 2 diabetes mellitus, uncontrolled, with hyperglycemia -continue insulin, sliding scale  CBG (last  3)  Recent Labs    06/18/21 1500 06/18/21 2015 06/19/21 0808  GLUCAP 184* 222* 101*    Acute kidney injury-resolved with fluids, creatinine is stable this morning   Essential hypertension-continue Norvasc, hold chlorthalidone   Hyperlipidemia-continue statin  Anemia of chronic illness-hemoglobin stable, no bleeding.   Scheduled Meds:  amLODipine  10 mg Oral Daily   atorvastatin  80 mg Oral Daily   Chlorhexidine Gluconate Cloth  6 each Topical Q0600   enoxaparin (LOVENOX) injection  40 mg Subcutaneous Q24H   insulin glargine-yfgn  40 Units Subcutaneous QHS   mupirocin ointment  1 application Nasal BID   Continuous Infusions:  sodium chloride     sodium chloride 100 mL/hr at 06/19/21 0318   cefTRIAXone (ROCEPHIN)  IV Stopped (06/18/21 1734)   DAPTOmycin (CUBICIN)  IV 800 mg (06/18/21 2038)   PRN Meds:.acetaminophen **OR** acetaminophen, magnesium hydroxide, morphine injection, ondansetron **OR** ondansetron (ZOFRAN) IV, traZODone  Diet Orders (From admission, onward)     Start     Ordered   06/20/21 0001  Diet NPO time specified  Diet effective midnight        06/19/21 1027   06/19/21 1027  Diet Carb Modified Fluid consistency: Thin; Room service appropriate? Yes  Diet effective now       Question Answer Comment  Diet-HS Snack? Nothing   Calorie Level Medium 1600-2000   Fluid consistency: Thin   Room service appropriate? Yes      06/19/21 1027            DVT prophylaxis: enoxaparin (LOVENOX) injection 40 mg Start: 06/18/21 1000  Lab Results  Component Value Date   PLT 367 06/19/2021      Code Status: Full Code  Family Communication: no family at bedside  Status is: Inpatient  Remains inpatient appropriate because: Severity of illness, OR tomorrow  Level of care: Med-Surg  Consultants:  Podiatry Vascular ID  Procedures:  none  Microbiology  none  Antimicrobials: Daptomycin / Ceftriaxone 3/3 >>    Objective: Vitals:   06/18/21 1715  06/18/21 2037 06/19/21 0500 06/19/21 0807  BP:  (!) 140/91 138/86 134/85  Pulse:  74 78 74  Resp:  18 18 20   Temp: 97.8 F (36.6 C) 97.9 F (36.6 C) 98 F (36.7 C) 98 F (36.7 C)  TempSrc: Oral Oral Oral Oral  SpO2:  100% 100% 100%  Weight:      Height:        Intake/Output Summary (Last 24 hours) at 06/19/2021 1120 Last data filed at 06/19/2021 0505 Gross per 24 hour  Intake 2214.78 ml  Output 1850 ml  Net 364.78 ml   Wt Readings from Last 3 Encounters:  06/17/21 129.3 kg  02/26/21 124.7 kg  02/22/14 104.3 kg    Examination:  Constitutional: NAD Eyes: no scleral icterus ENMT: Mucous membranes are moist.  Neck: normal, supple Respiratory: clear to auscultation bilaterally, no wheezing, no crackles. Normal respiratory effort. Cardiovascular: Regular rate and rhythm, no murmurs / rubs / gallops. No LE edema.  Abdomen: non distended, no tenderness. Bowel sounds positive.  Musculoskeletal: no clubbing / cyanosis.  Skin: no rashes Neurologic: non focal    Data Reviewed: I have independently reviewed following labs and imaging studies  CBC Recent Labs  Lab 06/17/21 1757 06/18/21 0455 06/19/21 0449  WBC 7.8 6.5 4.8  HGB 11.1* 10.2* 10.4*  HCT 34.5* 31.1* 31.2*  PLT 441* 386 367  MCV 80.6 81.0 79.6*  MCH 25.9* 26.6 26.5  MCHC 32.2 32.8 33.3  RDW 13.5 13.4 13.5  LYMPHSABS 1.6  --   --   MONOABS 0.7  --   --   EOSABS 0.2  --   --   BASOSABS 0.0  --   --     Recent Labs  Lab 06/17/21 1757 06/18/21 0455 06/18/21 1611 06/19/21 0449  NA 132* 138  --  137  K 3.9 3.7  --  3.7  CL 98 101  --  103  CO2 22 26  --  24  GLUCOSE 164* 172*  --  108*  BUN 27* 20  --  13  CREATININE 1.35* 1.19  --  1.08  CALCIUM 9.3 9.0  --  8.8*  AST  --   --   --  18  ALT  --   --   --  22  ALKPHOS  --   --   --  53  BILITOT  --   --   --  0.3  ALBUMIN  --   --   --  2.6*  CRP  --   --  10.2*  --   PROCALCITON  --  <0.10  --   --   INR  --  1.3*  --   --      ------------------------------------------------------------------------------------------------------------------ No results for input(s): CHOL, HDL, LDLCALC, TRIG, CHOLHDL, LDLDIRECT in the last 72 hours.  No results found for: HGBA1C ------------------------------------------------------------------------------------------------------------------ No results for input(s): TSH, T4TOTAL, T3FREE, THYROIDAB in the last 72 hours.  Invalid input(s): FREET3  Cardiac Enzymes No results for input(s): CKMB, TROPONINI, MYOGLOBIN in the last 168  hours.  Invalid input(s): CK ------------------------------------------------------------------------------------------------------------------ No results found for: BNP  CBG: Recent Labs  Lab 06/18/21 0906 06/18/21 1500 06/18/21 2015 06/19/21 0808  GLUCAP 251* 184* 222* 101*    Recent Results (from the past 240 hour(s))  WOUND CULTURE     Status: None (Preliminary result)   Collection Time: 06/17/21  3:41 PM   Specimen: Ulcer; Wound  Result Value Ref Range Status   MICRO NUMBER: [id number]  Preliminary   SPECIMEN QUALITY: Adequate  Preliminary   SOURCE: WOUND (SITE NOT SPECIFIED)  Preliminary   STATUS: PRELIMINARY  Preliminary   GRAM STAIN:   Preliminary    No white blood cells seen No epithelial cells seen No organisms seen  Resp Panel by RT-PCR (Flu A&B, Covid) Nasopharyngeal Swab     Status: None   Collection Time: 06/17/21 11:36 PM   Specimen: Nasopharyngeal Swab; Nasopharyngeal(NP) swabs in vial transport medium  Result Value Ref Range Status   SARS Coronavirus 2 by RT PCR NEGATIVE NEGATIVE Final    Comment: (NOTE) SARS-CoV-2 target nucleic acids are NOT DETECTED.  The SARS-CoV-2 RNA is generally detectable in upper respiratory specimens during the acute phase of infection. The lowest concentration of SARS-CoV-2 viral copies this assay can detect is 138 copies/mL. A negative result does not preclude SARS-Cov-2 infection and  should not be used as the sole basis for treatment or other patient management decisions. A negative result may occur with  improper specimen collection/handling, submission of specimen other than nasopharyngeal swab, presence of viral mutation(s) within the areas targeted by this assay, and inadequate number of viral copies(<138 copies/mL). A negative result must be combined with clinical observations, patient history, and epidemiological information. The expected result is Negative.  Fact Sheet for Patients:  BloggerCourse.com  Fact Sheet for Healthcare Providers:  SeriousBroker.it  This test is no t yet approved or cleared by the [place] FDA and  has been authorized for detection and/or diagnosis of SARS-CoV-2 by FDA under an Emergency Use Authorization (EUA). This EUA will remain  in effect (meaning this test can be used) for the duration of the COVID-19 declaration under Section 564(b)(1) of the Act, 21 U.S.C.section 360bbb-3(b)(1), unless the authorization is terminated  or revoked sooner.       Influenza A by PCR NEGATIVE NEGATIVE Final   Influenza B by PCR NEGATIVE NEGATIVE Final    Comment: (NOTE) The Xpert Xpress SARS-CoV-2/FLU/RSV plus assay is intended as an aid in the diagnosis of influenza from Nasopharyngeal swab specimens and should not be used as a sole basis for treatment. Nasal washings and aspirates are unacceptable for Xpert Xpress SARS-CoV-2/FLU/RSV testing.  Fact Sheet for Patients: BloggerCourse.com  Fact Sheet for Healthcare Providers: SeriousBroker.it  This test is not yet approved or cleared by the [place] FDA and has been authorized for detection and/or diagnosis of SARS-CoV-2 by FDA under an Emergency Use Authorization (EUA). This EUA will remain in effect (meaning this test can be used) for the duration of the COVID-19 declaration  under Section 564(b)(1) of the Act, 21 U.S.C. section 360bbb-3(b)(1), unless the authorization is terminated or revoked.  Performed at [facility], [address] [place], [place] [address]   Surgical pcr screen     Status: Abnormal   Collection Time: 06/18/21  7:48 PM   Specimen: Nasal Mucosa; Nasal Swab  Result Value Ref Range Status   MRSA, PCR NEGATIVE NEGATIVE Final   Staphylococcus aureus POSITIVE (A) NEGATIVE Final    Comment: (NOTE) The  Xpert SA Assay (FDA approved for NASAL specimens in patients 61 years of age and older), is one component of a comprehensive surveillance program. It is not intended to diagnose infection nor to guide or monitor treatment. Performed at [facility], [address] [place], [place] [address]      Radiology Studies: VAS US ABI WITH/WO TBI  Result Date: 06/18/2021  LOWER EXTREMITY DOPPLER STUDY Patient Name:  Ernest Edwards  Date of Exam:   06/18/2021 Medical Rec #: [id number]          Accession #:    [id number] Date of Birth: 05/30/1960           Patient Gender: M Patient Age:   [age] years [facility] Procedure:      VAS US ABI WITH/WO TBI Referring Phys: Pamella PertOSTIN Hailei Besser --------------------------------------------------------------------------------  Indications: Ulceration left great toe. High Risk Factors: Hypertension, Diabetes.  Limitations: Today's exam was limited due to unable to obtain left great toe              pressure. Comparison Study: no prior Performing Technologist: [place] PonderMegan Stricklin RVS  Examination Guidelines: A complete evaluation includes at minimum, Doppler waveform signals and systolic blood pressure reading at the level of bilateral brachial, anterior tibial, and posterior tibial arteries, when vessel segments are accessible. Bilateral testing is considered an integral part of a complete examination. Photoelectric Plethysmograph (PPG) waveforms and toe systolic pressure readings are included as  required and additional duplex testing as needed. Limited examinations for reoccurring indications may be performed as noted.  ABI Findings: +---------+------------------+-----+---------+--------+  Right     Rt Pressure (mmHg) Index Waveform  Comment   +---------+------------------+-----+---------+--------+  Brachial  180                      triphasic           +---------+------------------+-----+---------+--------+  PTA       240                1.33  triphasic           +---------+------------------+-----+---------+--------+  DP        240                1.33  triphasic           +---------+------------------+-----+---------+--------+  Great Toe 229                1.27  Normal              +---------+------------------+-----+---------+--------+ +--------+------------------+-----+---------+-------+  Left     Lt Pressure (mmHg) Index Waveform  Comment  +--------+------------------+-----+---------+-------+  Brachial 179                      triphasic          +--------+------------------+-----+---------+-------+  PTA      252                1.40  triphasic          +--------+------------------+-----+---------+-------+  DP       245                1.36  triphasic          +--------+------------------+-----+---------+-------+ +-------+-----------+----------------+------------+------------+  ABI/TBI Today's ABI Today's TBI      Previous ABI Previous TBI  +-------+-----------+----------------+------------+------------+  Right   1.33        1.27                                        +-------+-----------+----------------+------------+------------+  Left    1.40        unable to obtain                            +-------+-----------+----------------+------------+------------+   Summary: Right: Resting right ankle-brachial index indicates noncompressible right lower extremity arteries. The right toe-brachial index is normal. Left: Resting left ankle-brachial index indicates noncompressible left lower extremity arteries.  Unable to obtain great toe pressure due to great toe size and wound.  *See table(s) above for measurements and observations.  Electronically signed by Gerarda Fraction on 06/18/2021 at 7:24:37 PM.    Final      Pamella Pert, MD, PhD [facility]  Between 7 am - 7 pm I am available, please contact me via Amion (for emergencies) or Securechat (non urgent messages)  Between 7 pm - 7 am I am not available, please contact night coverage MD/APP via Amion

## 2021-06-19 NOTE — Plan of Care (Signed)
  Problem: Clinical Measurements: Goal: Diagnostic test results will improve Outcome: Progressing   Problem: Coping: Goal: Level of anxiety will decrease Outcome: Progressing   

## 2021-06-20 ENCOUNTER — Inpatient Hospital Stay (HOSPITAL_COMMUNITY): Payer: BC Managed Care – PPO | Admitting: Certified Registered Nurse Anesthetist

## 2021-06-20 ENCOUNTER — Other Ambulatory Visit: Payer: Self-pay

## 2021-06-20 ENCOUNTER — Encounter (HOSPITAL_COMMUNITY): Payer: Self-pay | Admitting: Family Medicine

## 2021-06-20 ENCOUNTER — Inpatient Hospital Stay (HOSPITAL_COMMUNITY): Payer: BC Managed Care – PPO

## 2021-06-20 ENCOUNTER — Encounter (HOSPITAL_COMMUNITY)
Admission: EM | Disposition: A | Discharge status: Home / Self Care | Payer: Self-pay | Source: Home / Self Care | Attending: Internal Medicine

## 2021-06-20 DIAGNOSIS — M86672 Other chronic osteomyelitis, left ankle and foot: Secondary | ICD-10-CM

## 2021-06-20 DIAGNOSIS — E11621 Type 2 diabetes mellitus with foot ulcer: Secondary | ICD-10-CM

## 2021-06-20 DIAGNOSIS — L97529 Non-pressure chronic ulcer of other part of left foot with unspecified severity: Secondary | ICD-10-CM

## 2021-06-20 HISTORY — PX: AMPUTATION: SHX166

## 2021-06-20 LAB — GLUCOSE, CAPILLARY
Glucose-Capillary: 128 mg/dL — ABNORMAL HIGH (ref 70–99)
Glucose-Capillary: 131 mg/dL — ABNORMAL HIGH (ref 70–99)
Glucose-Capillary: 146 mg/dL — ABNORMAL HIGH (ref 70–99)
Glucose-Capillary: 272 mg/dL — ABNORMAL HIGH (ref 70–99)
Glucose-Capillary: 249 mg/dL — ABNORMAL HIGH (ref 70–99)

## 2021-06-20 LAB — WOUND CULTURE
MICRO NUMBER:: 13079782
SPECIMEN QUALITY:: ADEQUATE

## 2021-06-20 SURGERY — AMPUTATION, FOOT, RAY
Anesthesia: Monitor Anesthesia Care | Site: Foot | Laterality: Left

## 2021-06-20 MED ORDER — PHENYLEPHRINE 40 MCG/ML (10ML) SYRINGE FOR IV PUSH (FOR BLOOD PRESSURE SUPPORT)
PREFILLED_SYRINGE | INTRAVENOUS | Status: AC
  Filled 2021-06-20: qty 10

## 2021-06-20 MED ORDER — PROPOFOL 500 MG/50ML IV EMUL
INTRAVENOUS | Status: DC | PRN
  Administered 2021-06-20: 100 ug/kg/min via INTRAVENOUS

## 2021-06-20 MED ORDER — MIDAZOLAM HCL 2 MG/2ML IJ SOLN
INTRAMUSCULAR | Status: DC | PRN
Start: 2021-06-20 — End: 2021-06-20
  Administered 2021-06-20: 2 mg via INTRAVENOUS

## 2021-06-20 MED ORDER — BUPIVACAINE HCL (PF) 0.25 % IJ SOLN
INTRAMUSCULAR | Status: AC
  Filled 2021-06-20: qty 30

## 2021-06-20 MED ORDER — CEFAZOLIN SODIUM-DEXTROSE 2-4 GM/100ML-% IV SOLN
2.0000 g | Freq: Three times a day (TID) | INTRAVENOUS | Status: DC
  Administered 2021-06-20 – 2021-06-22 (×5): 2 g via INTRAVENOUS
  Filled 2021-06-20 (×6): qty 100

## 2021-06-20 MED ORDER — ONDANSETRON HCL 4 MG/2ML IJ SOLN
INTRAMUSCULAR | Status: DC | PRN
  Administered 2021-06-20: 4 mg via INTRAVENOUS

## 2021-06-20 MED ORDER — FENTANYL CITRATE (PF) 250 MCG/5ML IJ SOLN
INTRAMUSCULAR | Status: DC | PRN
Start: 2021-06-20 — End: 2021-06-20
  Administered 2021-06-20: 50 ug via INTRAVENOUS

## 2021-06-20 MED ORDER — ACETAMINOPHEN 500 MG PO TABS
1000.0000 mg | ORAL_TABLET | Freq: Once | ORAL | Status: AC
  Administered 2021-06-20: 1000 mg via ORAL
  Filled 2021-06-20: qty 2

## 2021-06-20 MED ORDER — ROCURONIUM BROMIDE 10 MG/ML (PF) SYRINGE
PREFILLED_SYRINGE | INTRAVENOUS | Status: AC
  Filled 2021-06-20: qty 10

## 2021-06-20 MED ORDER — FENTANYL CITRATE (PF) 250 MCG/5ML IJ SOLN
INTRAMUSCULAR | Status: AC
  Filled 2021-06-20: qty 5

## 2021-06-20 MED ORDER — LIDOCAINE HCL (PF) 1 % IJ SOLN
INTRAMUSCULAR | Status: AC
  Filled 2021-06-20: qty 30

## 2021-06-20 MED ORDER — INSULIN ASPART 100 UNIT/ML IJ SOLN: 0.0000 [IU] | INTRAMUSCULAR | Status: DC | PRN

## 2021-06-20 MED ORDER — 0.9 % SODIUM CHLORIDE (POUR BTL) OPTIME
TOPICAL | Status: DC | PRN
Start: 2021-06-20 — End: 2021-06-20
  Administered 2021-06-20: 1000 mL

## 2021-06-20 MED ORDER — LIDOCAINE 2% (20 MG/ML) 5 ML SYRINGE
INTRAMUSCULAR | Status: AC
  Filled 2021-06-20: qty 5

## 2021-06-20 MED ORDER — MIDAZOLAM HCL 2 MG/2ML IJ SOLN
INTRAMUSCULAR | Status: AC
  Filled 2021-06-20: qty 2

## 2021-06-20 MED ORDER — ONDANSETRON HCL 4 MG/2ML IJ SOLN
INTRAMUSCULAR | Status: AC
  Filled 2021-06-20: qty 2

## 2021-06-20 MED ORDER — PROPOFOL 10 MG/ML IV BOLUS
INTRAVENOUS | Status: DC | PRN
  Administered 2021-06-20: 10 mg via INTRAVENOUS

## 2021-06-20 MED ORDER — LACTATED RINGERS IV SOLN: INTRAVENOUS | Status: DC

## 2021-06-20 MED ORDER — CHLORHEXIDINE GLUCONATE 0.12 % MT SOLN
OROMUCOSAL | Status: AC
  Administered 2021-06-20: 15 mL via OROMUCOSAL
  Filled 2021-06-20: qty 15

## 2021-06-20 MED ORDER — SODIUM CHLORIDE 0.9 % IR SOLN
Status: DC | PRN
  Administered 2021-06-20: 3000 mL

## 2021-06-20 MED ORDER — PHENYLEPHRINE 40 MCG/ML (10ML) SYRINGE FOR IV PUSH (FOR BLOOD PRESSURE SUPPORT)
PREFILLED_SYRINGE | INTRAVENOUS | Status: DC | PRN
Start: 2021-06-20 — End: 2021-06-20
  Administered 2021-06-20: 40 ug via INTRAVENOUS
  Administered 2021-06-20: 80 ug via INTRAVENOUS

## 2021-06-20 MED ORDER — GABAPENTIN 300 MG PO CAPS
300.0000 mg | ORAL_CAPSULE | Freq: Once | ORAL | Status: AC
Start: 2021-06-20 — End: 2021-06-20
  Administered 2021-06-20: 300 mg via ORAL
  Filled 2021-06-20: qty 1

## 2021-06-20 MED ORDER — LACTATED RINGERS IV SOLN: INTRAVENOUS | Status: DC | PRN

## 2021-06-20 MED ORDER — CHLORHEXIDINE GLUCONATE 0.12 % MT SOLN: 15.0000 mL | Freq: Once | OROMUCOSAL | Status: AC

## 2021-06-20 MED ORDER — ORAL CARE MOUTH RINSE: 15.0000 mL | Freq: Once | OROMUCOSAL | Status: AC

## 2021-06-20 MED ORDER — PROPOFOL 10 MG/ML IV BOLUS
INTRAVENOUS | Status: AC
  Filled 2021-06-20: qty 20

## 2021-06-20 MED ORDER — CELECOXIB 200 MG PO CAPS
200.0000 mg | ORAL_CAPSULE | Freq: Once | ORAL | Status: AC
  Administered 2021-06-20: 200 mg via ORAL
  Filled 2021-06-20: qty 1

## 2021-06-20 MED ORDER — LIDOCAINE HCL 1 % IJ SOLN
INTRAMUSCULAR | Status: DC | PRN
  Administered 2021-06-20: 10 mL via INTRAMUSCULAR

## 2021-06-20 MED ORDER — FENTANYL CITRATE (PF) 100 MCG/2ML IJ SOLN: 25.0000 ug | INTRAMUSCULAR | Status: DC | PRN

## 2021-06-20 MED ORDER — DEXAMETHASONE SODIUM PHOSPHATE 10 MG/ML IJ SOLN
INTRAMUSCULAR | Status: AC
  Filled 2021-06-20: qty 1

## 2021-06-20 SURGICAL SUPPLY — 56 items
APL PRP STRL LF DISP 70% ISPRP (MISCELLANEOUS) ×1
BAG COUNTER SPONGE SURGICOUNT (BAG) ×2 IMPLANT
BAG SPNG CNTER NS LX DISP (BAG) ×2
BANDAGE ESMARK 6X9 LF (GAUZE/BANDAGES/DRESSINGS) IMPLANT
BLADE AVERAGE 25X9 (BLADE) ×1 IMPLANT
BLADE SURG 15 STRL LF DISP TIS (BLADE) ×2 IMPLANT
BLADE SURG 15 STRL SS (BLADE) ×4
BNDG CMPR 9X6 STRL LF SNTH (GAUZE/BANDAGES/DRESSINGS)
BNDG ELASTIC 3X5.8 VLCR STR LF (GAUZE/BANDAGES/DRESSINGS) IMPLANT
BNDG ELASTIC 4X5.8 VLCR STR LF (GAUZE/BANDAGES/DRESSINGS) ×2 IMPLANT
BNDG ESMARK 6X9 LF (GAUZE/BANDAGES/DRESSINGS)
BNDG GAUZE ELAST 4 BULKY (GAUZE/BANDAGES/DRESSINGS) ×3 IMPLANT
CHLORAPREP W/TINT 26 (MISCELLANEOUS) ×2 IMPLANT
COVER BACK TABLE 60X90IN (DRAPES) ×2 IMPLANT
CUFF TOURN SGL QUICK 34 (TOURNIQUET CUFF)
CUFF TRNQT CYL 34X4.125X (TOURNIQUET CUFF) IMPLANT
DRAPE EXTREMITY T 121X128X90 (DISPOSABLE) ×1 IMPLANT
DRAPE IMP U-DRAPE 54X76 (DRAPES) ×1 IMPLANT
DRAPE OEC MINIVIEW 54X84 (DRAPES) IMPLANT
DRAPE SURG 17X23 STRL (DRAPES) IMPLANT
DRAPE U-SHAPE 47X51 STRL (DRAPES) ×1 IMPLANT
DRSG EMULSION OIL 3X3 NADH (GAUZE/BANDAGES/DRESSINGS) ×2 IMPLANT
ELECT REM PT RETURN 9FT ADLT (ELECTROSURGICAL) ×2
ELECTRODE REM PT RTRN 9FT ADLT (ELECTROSURGICAL) ×1 IMPLANT
GAUZE 4X4 16PLY ~~LOC~~+RFID DBL (SPONGE) IMPLANT
GAUZE SPONGE 4X4 12PLY STRL (GAUZE/BANDAGES/DRESSINGS) ×2 IMPLANT
GAUZE XEROFORM 5X9 LF (GAUZE/BANDAGES/DRESSINGS) ×1 IMPLANT
GLOVE SURG ENC MOIS LTX SZ7 (GLOVE) ×2 IMPLANT
GLOVE SURG UNDER POLY LF SZ7.5 (GLOVE) ×2 IMPLANT
GOWN STRL REUS W/ TWL LRG LVL3 (GOWN DISPOSABLE) ×1 IMPLANT
GOWN STRL REUS W/ TWL XL LVL3 (GOWN DISPOSABLE) ×1 IMPLANT
GOWN STRL REUS W/TWL LRG LVL3 (GOWN DISPOSABLE) ×2
GOWN STRL REUS W/TWL XL LVL3 (GOWN DISPOSABLE) ×2
KIT BASIN OR (CUSTOM PROCEDURE TRAY) ×2 IMPLANT
NDL HYPO 25X1 1.5 SAFETY (NEEDLE) ×1 IMPLANT
NDL SAFETY ECLIPSE 18X1.5 (NEEDLE) IMPLANT
NEEDLE HYPO 18GX1.5 SHARP (NEEDLE)
NEEDLE HYPO 25X1 1.5 SAFETY (NEEDLE) ×2 IMPLANT
NS IRRIG 1000ML POUR BTL (IV SOLUTION) IMPLANT
PACK ORTHO EXTREMITY (CUSTOM PROCEDURE TRAY) ×1 IMPLANT
PADDING CAST ABS 4INX4YD NS (CAST SUPPLIES) ×2
PADDING CAST ABS COTTON 4X4 ST (CAST SUPPLIES) ×2 IMPLANT
PENCIL SMOKE EVACUATOR (MISCELLANEOUS) ×2 IMPLANT
STAPLER VISISTAT 35W (STAPLE) ×1 IMPLANT
STOCKINETTE 6  STRL (DRAPES) ×2
STOCKINETTE 6 STRL (DRAPES) ×1 IMPLANT
SUT MNCRL AB 3-0 PS2 18 (SUTURE) IMPLANT
SUT MNCRL AB 4-0 PS2 18 (SUTURE) IMPLANT
SUT MON AB 5-0 PS2 18 (SUTURE) IMPLANT
SUT PROLENE 3 0 PS 2 (SUTURE) ×5 IMPLANT
SUT PROLENE 4 0 PS 2 18 (SUTURE) IMPLANT
SWAB CULTURE LIQUID MINI MALE (MISCELLANEOUS) IMPLANT
SYR BULB EAR ULCER 3OZ GRN STR (SYRINGE) ×2 IMPLANT
SYR CONTROL 10ML LL (SYRINGE) IMPLANT
UNDERPAD 30X36 HEAVY ABSORB (UNDERPADS AND DIAPERS) ×2 IMPLANT
YANKAUER SUCT BULB TIP NO VENT (SUCTIONS) ×2 IMPLANT

## 2021-06-20 NOTE — Progress Notes (Signed)
Patient refused vital signs to be taken per NT ?

## 2021-06-20 NOTE — Progress Notes (Signed)
PROGRESS NOTE  Ernest Edwards [id number] DOB: 08/22/1960 DOA: 06/17/2021 PCP: [facility], [facility]   LOS: 2 days   Brief Narrative / Interim history: 61 year old male with history of DM2, HTN, HLD who comes into the hospital with a chronic left foot diabetic ulcer, nonhealing, with high suspicion for osteomyelitis.  He was seen in podiatry office yesterday, felt to have worsening of his wound and he was sent to the hospital for antibiotics, MRI, ABIs, ID consult.  Podiatry, ID, vascular surgery consulted  Subjective / 24h Interval events: Seen postoperatively this morning.  Little to no pain in his foot  Assesement and Plan: Principal Problem:   Toe osteomyelitis, left (HCC) Active Problems:   Diabetic foot ulcer ([place])   Uncontrolled type 2 diabetes mellitus with hyperglycemia, with long-term current use of insulin (HCC)   AKI (acute kidney injury) ([place])   Essential hypertension   Dyslipidemia   Obesity, Class II, BMI 35-39.9   Assessment and Plan: Principal problem Toe osteomyelitis-patient was admitted to the hospital from podiatry office.  Underwent an MRI of the left foot on admission on 3/3 which showed a large soft tissue ulcer at the plantar base of the first MTP, also septic arthritis with osteomyelitis of the first proximal phalanx, first metatarsal and hallux sesamoids, as well as a 1.5 x 0.8 x 1.4 abscess medial to the first metatarsal head.  Vascular also consulted due to noncompressive bilateral lower extremity arteries, but given good pulses/waveforms no current interventions planned as he appears to have adequate blood flow for healing. 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  Operative report mentions that bone margins were clean on gross examination.  Continue to monitor over the next 24 hours, assess for pain level, PT consulted as well and awaiting ID input on Monday regarding  antibiotics  Active problems Type 2 diabetes mellitus, uncontrolled, with hyperglycemia -continue insulin, sliding scale, CBGs controlled  CBG (last 3)  Recent Labs    06/19/21 2056 06/20/21 0629 06/20/21 0829  GLUCAP 172* 128* 131*    Acute kidney injury-resolved with fluids, repeat labs tomorrow morning   Essential hypertension-continue Norvasc, hold chlorthalidone   Hyperlipidemia-continue statin  Anemia of chronic illness-hemoglobin stable, no bleeding.   Scheduled Meds:  amLODipine  10 mg Oral Daily   atorvastatin  80 mg Oral Daily   Chlorhexidine Gluconate Cloth  6 each Topical Q0600   enoxaparin (LOVENOX) injection  40 mg Subcutaneous Q24H   insulin glargine-yfgn  40 Units Subcutaneous QHS   mupirocin ointment  1 application Nasal BID   Continuous Infusions:  sodium chloride     sodium chloride 100 mL/hr at 06/20/21 0335   cefTRIAXone (ROCEPHIN)  IV 2 g (06/19/21 1737)   DAPTOmycin (CUBICIN)  IV 800 mg (06/19/21 2059)   PRN Meds:.acetaminophen **OR** acetaminophen, magnesium hydroxide, morphine injection, ondansetron **OR** ondansetron (ZOFRAN) IV, traZODone  Diet Orders (From admission, onward)     Start     Ordered   06/20/21 0911  Diet Carb Modified Fluid consistency: Thin; Room service appropriate? Yes  Diet effective now       Question Answer Comment  Diet-HS Snack? Nothing   Calorie Level Medium 1600-2000   Fluid consistency: Thin   Room service appropriate? Yes      06/20/21 0910            DVT prophylaxis: enoxaparin (LOVENOX) injection 40 mg Start: 06/18/21 1000  Lab Results  Component Value Date   PLT 367 06/19/2021      Code Status: Full Code  Family Communication: no family at bedside  Status is: Inpatient  Remains inpatient appropriate because: Postop day 0 today  Level of care: Med-Surg  Consultants:  Podiatry Vascular ID  Procedures:  First ray amputation, left  Microbiology  Intraoperative cultures  3/5-NGTD  Antimicrobials: Daptomycin / Ceftriaxone 3/3 >>    Objective: Vitals:   06/20/21 0828 06/20/21 0842 06/20/21 0852 06/20/21 0918  BP: 113/73 125/77 122/83 129/87  Pulse: 79 75 70 73  Resp: 20 15 11 17   Temp: 97.9 F (36.6 C)  98.2 F (36.8 C) 98 F (36.7 C)  TempSrc:      SpO2: 99% 100% 99% 99%  Weight:      Height:        Intake/Output Summary (Last 24 hours) at 06/20/2021 1114 Last data filed at 06/20/2021 0820 Gross per 24 hour  Intake 1354.88 ml  Output 3470 ml  Net -2115.12 ml    Wt Readings from Last 3 Encounters:  06/20/21 129.3 kg  02/26/21 124.7 kg  02/22/14 104.3 kg    Examination:  Constitutional: NAD Eyes: lids and conjunctivae normal, no scleral icterus ENMT: mmm Neck: normal, supple Respiratory: clear to auscultation bilaterally, no wheezing, no crackles. Normal respiratory effort.  Cardiovascular: Regular rate and rhythm, no murmurs / rubs / gallops. No LE edema. Abdomen: soft, no distention, no tenderness. Bowel sounds positive.  Skin: no rashes Neurologic: no focal deficits, equal strength   Data Reviewed: I have independently reviewed following labs and imaging studies  CBC Recent Labs  Lab 06/17/21 1757 06/18/21 0455 06/19/21 0449  WBC 7.8 6.5 4.8  HGB 11.1* 10.2* 10.4*  HCT 34.5* 31.1* 31.2*  PLT 441* 386 367  MCV 80.6 81.0 79.6*  MCH 25.9* 26.6 26.5  MCHC 32.2 32.8 33.3  RDW 13.5 13.4 13.5  LYMPHSABS 1.6  --   --   MONOABS 0.7  --   --   EOSABS 0.2  --   --   BASOSABS 0.0  --   --      Recent Labs  Lab 06/17/21 1757 06/18/21 0455 06/18/21 1611 06/19/21 0449  NA 132* 138  --  137  K 3.9 3.7  --  3.7  CL 98 101  --  103  CO2 22 26  --  24  GLUCOSE 164* 172*  --  108*  BUN 27* 20  --  13  CREATININE 1.35* 1.19  --  1.08  CALCIUM 9.3 9.0  --  8.8*  AST  --   --   --  18  ALT  --   --   --  22  ALKPHOS  --   --   --  53  BILITOT  --   --   --  0.3  ALBUMIN  --   --   --  2.6*  CRP  --   --  10.2*  --    PROCALCITON  --  <0.10  --   --   INR  --  1.3*  --   --      ------------------------------------------------------------------------------------------------------------------ No results for input(s): CHOL, HDL, LDLCALC, TRIG, CHOLHDL, LDLDIRECT in the last 72 hours.  No results found for: HGBA1C ------------------------------------------------------------------------------------------------------------------ No results for input(s): TSH, T4TOTAL, T3FREE, THYROIDAB in the last 72 hours.  Invalid input(s): FREET3  Cardiac Enzymes No results for input(s): CKMB, TROPONINI, MYOGLOBIN in the last 168  hours.  Invalid input(s): CK ------------------------------------------------------------------------------------------------------------------ No results found for: BNP  CBG: Recent Labs  Lab 06/19/21 1134 06/19/21 1627 06/19/21 2056 06/20/21 0629 06/20/21 0829  GLUCAP 173* 119* 172* 128* 131*     Recent Results (from the past 240 hour(s))  WOUND CULTURE     Status: Abnormal   Collection Time: 06/17/21  3:41 PM   Specimen: Ulcer; Wound  Result Value Ref Range Status   MICRO NUMBER: [id number]  Final   SPECIMEN QUALITY: Adequate  Final   SOURCE: WOUND (SITE NOT SPECIFIED)  Final   STATUS: FINAL  Final   GRAM STAIN:   Final    No white blood cells seen No epithelial cells seen No organisms seen   ISOLATE 1: Staphylococcus aureus (A)  Final    Comment: Light growth of Staphylococcus aureus      Susceptibility   Staphylococcus aureus - AEROBIC CULT, GRAM STAIN POSITIVE 1    VANCOMYCIN 1 Sensitive     CIPROFLOXACIN <=0.5 Sensitive     CLINDAMYCIN <=0.25 Sensitive     LEVOFLOXACIN <=0.12 Sensitive     ERYTHROMYCIN <=0.25 Sensitive     GENTAMICIN <=0.5 Sensitive     OXACILLIN* <=0.25 Sensitive      * Oxacillin susceptible staphylococci are susceptible to other penicillinase-stable penicillins (e.g., methicillin, nafcillin), beta- lactam/beta-lactamase inhibitor  combinations, and cephems with staphylococcal indications, including cefazolin.     TETRACYCLINE <=1 Sensitive     TRIMETH/SULFA* <=10 Sensitive      * Oxacillin susceptible staphylococci are susceptible to other penicillinase-stable penicillins (e.g., methicillin, nafcillin), beta- lactam/beta-lactamase inhibitor combinations, and cephems with staphylococcal indications, including cefazolin. Legend: S = Susceptible  I = Intermediate R = Resistant  NS = Not susceptible * = Not tested  NR = Not reported **NN = See antimicrobic comments   Resp Panel by RT-PCR (Flu A&B, Covid) Nasopharyngeal Swab     Status: None   Collection Time: 06/17/21 11:36 PM   Specimen: Nasopharyngeal Swab; Nasopharyngeal(NP) swabs in vial transport medium  Result Value Ref Range Status   SARS Coronavirus 2 by RT PCR NEGATIVE NEGATIVE Final    Comment: (NOTE) SARS-CoV-2 target nucleic acids are NOT DETECTED.  The SARS-CoV-2 RNA is generally detectable in upper respiratory specimens during the acute phase of infection. The lowest concentration of SARS-CoV-2 viral copies this assay can detect is 138 copies/mL. A negative result does not preclude SARS-Cov-2 infection and should not be used as the sole basis for treatment or other patient management decisions. A negative result may occur with  improper specimen collection/handling, submission of specimen other than nasopharyngeal swab, presence of viral mutation(s) within the areas targeted by this assay, and inadequate number of viral copies(<138 copies/mL). A negative result must be combined with clinical observations, patient history, and epidemiological information. The expected result is Negative.  Fact Sheet for Patients:  EntrepreneurPulse.com.au  Fact Sheet for Healthcare Providers:  IncredibleEmployment.be  This test is no t yet approved or cleared by the [place] FDA and  has been authorized for  detection and/or diagnosis of SARS-CoV-2 by FDA under an Emergency Use Authorization (EUA). This EUA will remain  in effect (meaning this test can be used) for the duration of the COVID-19 declaration under Section 564(b)(1) of the Act, 21 U.S.C.section 360bbb-3(b)(1), unless the authorization is terminated  or revoked sooner.       Influenza A by PCR NEGATIVE NEGATIVE Final   Influenza B by PCR NEGATIVE NEGATIVE Final    Comment: (NOTE) The Xpert  Xpress SARS-CoV-2/FLU/RSV plus assay is intended as an aid in the diagnosis of influenza from Nasopharyngeal swab specimens and should not be used as a sole basis for treatment. Nasal washings and aspirates are unacceptable for Xpert Xpress SARS-CoV-2/FLU/RSV testing.  Fact Sheet for Patients: EntrepreneurPulse.com.au  Fact Sheet for Healthcare Providers: IncredibleEmployment.be  This test is not yet approved or cleared by the [place] FDA and has been authorized for detection and/or diagnosis of SARS-CoV-2 by FDA under an Emergency Use Authorization (EUA). This EUA will remain in effect (meaning this test can be used) for the duration of the COVID-19 declaration under Section 564(b)(1) of the Act, 21 U.S.C. section 360bbb-3(b)(1), unless the authorization is terminated or revoked.  Performed at [facility], [place] [address], [place], [place] [address]   Surgical pcr screen     Status: Abnormal   Collection Time: 06/18/21  7:48 PM   Specimen: Nasal Mucosa; Nasal Swab  Result Value Ref Range Status   MRSA, PCR NEGATIVE NEGATIVE Final   Staphylococcus aureus POSITIVE (A) NEGATIVE Final    Comment: (NOTE) The Xpert SA Assay (FDA approved for NASAL specimens in patients 81 years of age and older), is one component of a comprehensive surveillance program. It is not intended to diagnose infection nor to guide or monitor treatment. Performed at [facility], [place] [address]  [place], [place] [address]       Radiology Studies: DG Foot Complete Left  Result Date: 06/20/2021 CLINICAL DATA:  Status post surgery. EXAM: LEFT FOOT - COMPLETE 3+ VIEW COMPARISON:  06/17/2021 FINDINGS: Interval transmetatarsal amputation of the first metatarsal through the proximal shaft. Prior transmetatarsal amputation of the fifth metatarsal through the mid diaphysis. Surgical staples along the medial aspect of the forefoot. No periosteal reaction or bone destruction. No acute fracture or dislocation. Small plantar calcaneal spur. Degenerative changes of the navicular-cuneiform joints. IMPRESSION: 1. Interval transmetatarsal amputation of the first metatarsal through the proximal shaft. 2. Prior transmetatarsal amputation of the fifth metatarsal through the mid diaphysis. Surgical staples along the medial aspect of the forefoot. Electronically Signed   By: Kathreen Devoid M.D.   On: 06/20/2021 09:13     Marzetta Board, MD, PhD [facility]  Between 7 am - 7 pm I am available, please contact me via Amion (for emergencies) or Securechat (non urgent messages)  Between 7 pm - 7 am I am not available, please contact night coverage MD/APP via Amion

## 2021-06-20 NOTE — Progress Notes (Signed)
PT Cancellation Note ? ?Patient Details ?Name: Ernest Edwards ?MRN: [id number] ?DOB: 01/18/1961 ? ? ?Cancelled Treatment:    Reason Eval/Treat Not Completed: Other (comment) Noted imminent d/c order but upon my arrival he has not received post-op shoe or CAM boot as ordered by MD for mobility. I attempted to call ortho tech with no answer. Will return later this afternoon to complete evaluation.  ? ?Vickki Muff, PT, DPT  ? ?Acute Rehabilitation Department ?Pager #: [phone] ? ? ?Ronnie Derby ?06/20/2021, 2:49 PM ?

## 2021-06-20 NOTE — Op Note (Signed)
Surgeon: Surgeon(s): ?Felipa Furnace, DPM  ?Assistants: None ?Pre-operative diagnosis: Osteomylitis Left foot  ?Post-operative diagnosis: same ?Procedure: Procedure(s) (LRB): ?Partial First Ray Amputation, left foot (Left)  ?Pathology:  ?ID Type Source Tests Collected by Time Destination  ?1 : Left foot first ray (dirty) Tissue PATH Amputaion Arm/Leg SURGICAL PATHOLOGY Felipa Furnace, DPM 06/20/2021 1601   ?2 : Left foot bone clean margin Tissue PATH Bone biopsy SURGICAL PATHOLOGY, AEROBIC/ANAEROBIC CULTURE W GRAM STAIN (SURGICAL/DEEP WOUND) Felipa Furnace, DPM 06/20/2021 0802   ?A : Left foot abscess Body Fluid PATH Other AEROBIC/ANAEROBIC CULTURE W GRAM STAIN (SURGICAL/DEEP WOUND) Felipa Furnace, DPM 06/20/2021 0801   ?  ?Pertinent Intra-op findings: Osteomyelitis noted to the first metatarsal head as well as the base of the proximal phalanx including the sesamoid with soft friable both.  The clean margins were hard indurated and without any signs of bone infection ?Anesthesia: Choice  ?Hemostasis: None ?EBL: 50 cc ?Materials: 3-0 Prolene and skin staple ?Injectables: 10 cc of half percent Marcaine plain 1% lidocaine plain ?Complications: None ? ?Indications for surgery: ?A 61 y.o. male presents with left foot osteomyelitis with a history of partial fifth ray amputation. Patient has failed all conservative therapy including but not limited to local wound care and IV antibiotics. He wishes to have surgical correction of the foot/deformity. It was determined that patient would benefit from left foot partial first ray amputation with primary closure. ?Informed surgical risk consent was reviewed and read aloud to the patient.  I reviewed the films.  I have discussed my findings with the patient in great detail.  I have discussed all risks including but not limited to infection, stiffness, scarring, limp, disability, deformity, damage to blood vessels and nerves, numbness, poor healing, need for braces, arthritis, chronic  pain, amputation, death.  All benefits and realistic expectations discussed in great detail.  I have made no promises as to the outcome.  I have provided realistic expectations.  I have offered the patient a 2nd opinion, which they have declined and assured me they preferred to proceed despite the risks ? ? ?Procedure in detail: ?The patient was both verbally and visually identified by myself, the nursing staff, and anesthesia staff in the preoperative holding area. They were then transferred to the operating room and placed on the operative table in supine position. ? ?Attention was directed to the left first ray, a skin marker used to delineate a fishmouth style incision incorporating the plantar submetatarsal 1 ulceration.  Using #10 blade the incision was carried down to epidermal dermal layer down to the level of the bone.  The toe was disarticulated metatarsophalangeal joint.  At this time it is important no osteomyelitic changes noted at the metatarsal head as well as the sesamoid and base of the proximal phalanx.  This was determined with soft friable bone.  All of this was sent to pathology as dirty margins.  Using sagittal saw transmetatarsal osteotomy of the first met was done in standard technique.  Once again this was sent as dirty margins for pathology.  At this time it was determined that this wound can be primarily closed.  No purulent drainage was expressed.  Using pulse lavage and 3 L saline the wound was thoroughly irrigated.  No further infection noted no osteomyelitis noted.  Multiple wound cultures were taken and sterile technique of the clean margin.  Bone was sent to microbiology and pathology of the clean margin first metatarsal.  The wound was primarily closed with  3-0 Prolene and skin staples.  Patient is a high risk of dehiscence.  He is also high risk of transmetatarsal history of amputation as he has lost the integrity of the first and the fifth ray.  All bony prominences were  adequately padded with Xeroform Betadine 4 x 4 gauze Kerlix Kling Ace. ? ?Disposition ?Patient is okay to be discharged from my standpoint.  The clean margin bone was hard and indurated and clear of bone infection.  We will continue to follow pathology and culture and extend antibiotics as needed.  Antibiotics recommendation per infectious disease.  No dressing change until follow-up.  Nonweightbearing to the left lower extremity with a surgical shoe.  Patient will follow-up 1 week from discharge ? ?At the conclusion of the procedure the patient was awoken from anesthesia and found to have tolerated the procedure well any complications. There were transferred to PACU with vital signs stable and vascular status intact. ? ?Boneta Lucks, DPM ? ?

## 2021-06-20 NOTE — Progress Notes (Signed)
Patient worked with PT this afternoon. There was some bleeding. I reinforced the dressing with gauze and acewrap ? ?

## 2021-06-20 NOTE — Progress Notes (Signed)
Pharmacy Antibiotic Note ? ?Ernest Edwards is a 61 y.o. male admitted on 06/17/2021 with code sepsis.  Wound culture  on 3/2 grew MSSA.  S/p Partial First Ray Amputation, left foot (Left) 3/5.   ID has discontinued Ceftriaxone and Daptomycin and switching to Cefazolin.  Pharmacy consulted on 06/20/21 for Cefazolin dosing for osteomyelitis. ? ?Plan: ?Start Cefazolin 2 gm IV every 8 hours.  ?Monitor clinical status, renal function and culture results daily.  ? ? ?Height: 6' 2.02" (188 cm) ?Weight: 129.3 kg (285 lb) ?IBW/kg (Calculated) : 82.24 ? ?Temp (24hrs), Avg:98.3 ?F (36.8 ?C), Min:97.9 ?F (36.6 ?C), Max:98.9 ?F (37.2 ?C) ? ?Recent Labs  ?Lab 06/17/21 ?1757 06/18/21 ?0455 06/19/21 ?1856  ?WBC 7.8 6.5 4.8  ?CREATININE 1.35* 1.19 1.08  ?  ?Estimated Creatinine Clearance: 103.9 mL/min (by C-G formula based on SCr of 1.08 mg/dL).   ? ?No Known Allergies ? ?Antimicrobials this admission: ?Dapto 3/3>3/5 ?Ceftriaxone 3/3>3/5 ?Cefazolin 3/5>> ? ?Microbiology:  ?3/2 Wound Cx: MSSA ?3/3 MRSA PCR: negative;  Staph aureus: positive ?3/5 Wound tissue (bone biopsy) cx: sent ?3/6 Body fluid cx:  sent ?  ? ? ? ?Thank you for allowing pharmacy to be part of this patients care team. ? ?Noah Delaine, RPh ?Clinical Pharmacist ?06/20/2021 5:59 PM ?Please check AMION for all [facility] Pharmacy phone numbers ?After 10:00 PM, call Main Pharmacy [phone] ? ? ?

## 2021-06-20 NOTE — Progress Notes (Addendum)
Orthopedic Tech Progress Note ?Patient Details:  ?Nima Kemppainen Deboy ?05/31/1960 ?[id number] ? ?Spoke with Florentina Addison (PT) as there were two orders in for a PO shoe and CAM boot and  one or the other is required to work with pt OOB. Both orders were entered at the same time, so it was unclear which was preferred. Initially brought a PO shoe as it would accommodate the bulky dressing on the LLE unlike a CAM boot and also as a "surgical shoe" was specified in Dr. Eliane Decree Op Note from 06/20/2020 at 0830. Arrived at the pt's room where he refused the PO shoe and insisted on the CAM boot as he told me he had a conversation with a doctor about getting a boot which I was not present for. I returned shortly after with a CAM boot and kindly explained how the boot will most likely not fully close over the bulky dressing despite it being a size "large", but applied it to the LLE per pt request. CAM boot would not completely close over the L forefoot and the most distal velcro strap cannot close either, but other than that it fits well. Pt denies any areas of irritation or discomfort from the boot. ? ?Ortho Devices ?Type of Ortho Device: CAM walker ?Ortho Device/Splint Location: LLE ?Ortho Device/Splint Interventions: Ordered, Application, Adjustment ?  ?Post Interventions ?Patient Tolerated: Well ?Instructions Provided: Care of device, Adjustment of device ? ?Reace Breshears Carmine Savoy ?06/20/2021, 4:07 PM ? ?

## 2021-06-20 NOTE — Anesthesia Preprocedure Evaluation (Signed)
Anesthesia Evaluation  ?Patient identified by MRN, date of birth, ID band ?Patient awake ? ? ? ?Reviewed: ?Allergy & Precautions, NPO status , Patient's Chart, lab work & pertinent test results ? ?Airway ?Mallampati: II ? ?TM Distance: >3 FB ?Neck ROM: Full ? ? ? Dental ? ?(+) Dental Advisory Given ?  ?Pulmonary ?neg pulmonary ROS,  ?  ?breath sounds clear to auscultation ? ? ? ? ? ? Cardiovascular ?hypertension, Pt. on medications ? ?Rhythm:Regular Rate:Normal ? ? ?  ?Neuro/Psych ?negative neurological ROS ?   ? GI/Hepatic ?negative GI ROS, Neg liver ROS,   ?Endo/Other  ?diabetes ? Renal/GU ?Renal disease  ? ?  ?Musculoskeletal ? ? Abdominal ?  ?Peds ? Hematology ? ?(+) Blood dyscrasia, anemia ,   ?Anesthesia Other Findings ? ? Reproductive/Obstetrics ? ?  ? ? ? ? ? ? ? ? ? ? ? ? ? ?  ?  ? ? ? ? ? ? ? ? ?Anesthesia Physical ?Anesthesia Plan ? ?ASA: 3 ? ?Anesthesia Plan: MAC  ? ?Post-op Pain Management: Tylenol PO (pre-op)*, Celebrex PO (pre-op)* and Gabapentin PO (pre-op)*  ? ?Induction: Intravenous ? ?PONV Risk Score and Plan: 1 and Propofol infusion, Ondansetron and Treatment may vary due to age or medical condition ? ?Airway Management Planned: Natural Airway and Simple Face Mask ? ?Additional Equipment:  ? ?Intra-op Plan:  ? ?Post-operative Plan:  ? ?Informed Consent: I have reviewed the patients History and Physical, chart, labs and discussed the procedure including the risks, benefits and alternatives for the proposed anesthesia with the patient or authorized representative who has indicated his/her understanding and acceptance.  ? ? ? ? ? ?Plan Discussed with:  ? ?Anesthesia Plan Comments:   ? ? ? ? ? ? ?Anesthesia Quick Evaluation ? ?

## 2021-06-20 NOTE — Anesthesia Postprocedure Evaluation (Signed)
Anesthesia Post Note ? ?Patient: Schawn Byas Handrich ? ?Procedure(s) Performed: Partial First Ray Amputation, left foot (Left: Foot) ? ?  ? ?Patient location during evaluation: PACU ?Anesthesia Type: MAC ?Level of consciousness: awake and alert ?Pain management: pain level controlled ?Vital Signs Assessment: post-procedure vital signs reviewed and stable ?Respiratory status: spontaneous breathing, nonlabored ventilation, respiratory function stable and patient connected to nasal cannula oxygen ?Cardiovascular status: stable and blood pressure returned to baseline ?Postop Assessment: no apparent nausea or vomiting ?Anesthetic complications: no ? ? ?No notable events documented. ? ?Last Vitals:  ?Vitals:  ? 06/20/21 0852 06/20/21 0918  ?BP: 122/83 129/87  ?Pulse: 70 73  ?Resp: 11 17  ?Temp: 36.8 ?C 36.7 ?C  ?SpO2: 99% 99%  ?  ?Last Pain:  ?Vitals:  ? 06/20/21 0852  ?TempSrc:   ?PainSc: 0-No pain  ? ? ?  ?  ?  ?  ?  ?  ? ?Suzette Battiest E ? ? ? ? ?

## 2021-06-20 NOTE — Plan of Care (Signed)
  Problem: Education: Goal: Knowledge of General Education information will improve Description Including pain rating scale, medication(s)/side effects and non-pharmacologic comfort measures Outcome: Progressing   

## 2021-06-20 NOTE — Evaluation (Signed)
Physical Therapy Evaluation ?Patient Details ?Name: Ernest Edwards ?MRN: [id number] ?DOB: 01-Dec-1960 ?Today's Date: 06/20/2021 ? ?History of Present Illness ? The pt is a 61 yo male presenting 3/2 with L foot wound, now s/p partial L first ray amputation on 3/5. PMH includes: DM II, HTN, and dyslipidemia. ?  ?Clinical Impression ? Pt in bed upon arrival of PT, agreeable to evaluation at this time. Prior to admission the pt was mobilizing without AD, living alone in an apt with a flight of stairs to enter. The pt reports he has good support from neighbors and friends at d/c. The pt was able to complete bed mobility without issue and is mobilizing short distances with RW on supervision level. He maintained NWB LLE well with gait, but did have some bleeding on dressing after session (RN aware). The pt was educated on LE strengthening for LLE while NWB, will benefit from follow up session for stair training prior to d/c.    ? ?Recommendations for follow up therapy are one component of a multi-disciplinary discharge planning process, led by the attending physician.  Recommendations may be updated based on patient status, additional functional criteria and insurance authorization. ? ?Follow Up Recommendations No PT follow up ? ?  ?Assistance Recommended at Discharge Intermittent Supervision/Assistance  ?Patient can return home with the following ? Help with stairs or ramp for entrance;Assistance with cooking/housework ? ?  ?Equipment Recommendations None recommended by PT  ?Recommendations for Other Services ?    ?  ?Functional Status Assessment Patient has had a recent decline in their functional status and demonstrates the ability to make significant improvements in function in a reasonable and predictable amount of time.  ? ?  ?Precautions / Restrictions Precautions ?Precautions: Fall ?Restrictions ?Weight Bearing Restrictions: Yes ?LLE Weight Bearing: Non weight bearing ?Other Position/Activity Restrictions:  Nonweightbearing to the left lower extremity with a surgical shoe or CAM walker boot  ? ?  ? ?Mobility ? Bed Mobility ?Overal bed mobility: Independent ?  ?  ?  ?  ?  ?  ?  ?  ? ?Transfers ?Overall transfer level: Needs assistance ?Equipment used: Rolling walker (2 wheels) ?Transfers: Sit to/from Stand ?Sit to Stand: Supervision ?  ?  ?  ?  ?  ?General transfer comment: cues for technique, pt then standing without assist ?  ? ?Ambulation/Gait ?Ambulation/Gait assistance: Min guard ?Gait Distance (Feet): 15 Feet (+ 25 ft) ?Assistive device: Rolling walker (2 wheels) ?  ?Gait velocity: decreased ?Gait velocity interpretation: <1.31 ft/sec, indicative of household ambulator ?  ?General Gait Details: hop-to pattern with BUE support on RW, maintains NWB well. fatigues quickly ? ?  ? ?Balance Overall balance assessment: Mild deficits observed, not formally tested ?  ?  ?  ?  ?  ?  ?  ?  ?  ?  ?  ?  ?  ?  ?  ?  ?  ?  ?   ? ? ? ?Pertinent Vitals/Pain Pain Assessment ?Pain Assessment: No/denies pain  ? ? ?Home Living Family/patient expects to be discharged to:: Private residence ?Living Arrangements: Alone ?Available Help at Discharge: Friend(s);Available PRN/intermittently ?Type of Home: Apartment ?Home Access: Stairs to enter ?Entrance Stairs-Rails: Right;Left;Can reach both ?Entrance Stairs-Number of Steps: flight ?  ?Home Layout: One level ?Home Equipment: Rolling Walker (2 wheels);BSC/3in1;Grab bars - tub/shower ?   ?  ?Prior Function Prior Level of Function : Independent/Modified Independent;Working/employed;Driving ?  ?  ?  ?  ?  ?  ?Mobility Comments: independent ?  ADLs Comments: AP/greeter at [employer] ?  ? ? ?Hand Dominance  ? Dominant Hand: Right ? ?  ?Extremity/Trunk Assessment  ? Upper Extremity Assessment ?Upper Extremity Assessment: Overall WFL for tasks assessed ?  ? ?Lower Extremity Assessment ?Lower Extremity Assessment: Overall WFL for tasks assessed ?  ? ?Cervical / Trunk Assessment ?Cervical / Trunk  Assessment: Normal  ?Communication  ? Communication: No difficulties  ?Cognition Arousal/Alertness: Awake/alert ?Behavior During Therapy: [facility] for tasks assessed/performed ?Overall Cognitive Status: Within Functional Limits for tasks assessed ?  ?  ?  ?  ?  ?  ?  ?  ?  ?  ?  ?  ?  ?  ?  ?  ?  ?  ?  ? ?  ?General Comments General comments (skin integrity, edema, etc.): VSS on RA, dressing with blood after session. RN called who applied additional dressing ? ?  ?Exercises    ? ?Assessment/Plan  ?  ?PT Assessment Patient needs continued PT services  ?PT Problem List Decreased strength;Decreased activity tolerance;Decreased mobility;Decreased balance ? ?   ?  ?PT Treatment Interventions DME instruction;Gait training;Stair training;Functional mobility training;Therapeutic activities;Therapeutic exercise;Balance training;Patient/family education   ? ?PT Goals (Current goals can be found in the Care Plan section)  ?Acute Rehab PT Goals ?Patient Stated Goal: return home ?PT Goal Formulation: With patient ?Time For Goal Achievement: 07/04/21 ?Potential to Achieve Goals: Good ? ?  ?Frequency Min 3X/week ?  ? ? ?   ?AM-PAC PT "6 Clicks" Mobility  ?Outcome Measure Help needed turning from your back to your side while in a flat bed without using bedrails?: None ?Help needed moving from lying on your back to sitting on the side of a flat bed without using bedrails?: None ?Help needed moving to and from a bed to a chair (including a wheelchair)?: None ?Help needed standing up from a chair using your arms (e.g., wheelchair or bedside chair)?: None ?Help needed to walk in hospital room?: A Little ?Help needed climbing 3-5 steps with a railing? : A Little ?6 Click Score: 22 ? ?  ?End of Session Equipment Utilized During Treatment: Gait belt ?Activity Tolerance: Patient tolerated treatment well ?Patient left: in bed;with call bell/phone within reach ?Nurse Communication: Mobility status;Other (comment) (dressing with some blood on  it) ?PT Visit Diagnosis: Unsteadiness on feet (R26.81);Other abnormalities of gait and mobility (R26.89);Muscle weakness (generalized) (M62.81) ?  ? ?Time: 1437-1447 (plus 1626 - 1647 (21 min)) ?PT Time Calculation (min) (ACUTE ONLY): 10 min ? ? ?Charges:   PT Evaluation ?$PT Eval Low Complexity: 1 Low ?PT Treatments ?$Therapeutic Exercise: 8-22 mins ?  ?   ? ? ?Vickki Muff, PT, DPT  ? ?Acute Rehabilitation Department ?Pager #: [phone] - [phone] ? ?Ernest Edwards ?06/20/2021, 5:25 PM ? ?

## 2021-06-20 NOTE — Interval H&P Note (Signed)
History and Physical Interval Note: ? ?06/20/2021 ?7:30 AM ? ?Ernest Edwards  has presented today for surgery, with the diagnosis of Osteomylitis Left foot.  The various methods of treatment have been discussed with the patient and family. After consideration of risks, benefits and other options for treatment, the patient has consented to  Procedure(s): ?Partial First Ray Amputation, left foot (Left) as a surgical intervention.  The patient's history has been reviewed, patient examined, no change in status, stable for surgery.  I have reviewed the patient's chart and labs.  Questions were answered to the patient's satisfaction.   ? ? ?Felipa Furnace ? ? ?

## 2021-06-20 NOTE — Transfer of Care (Signed)
Immediate Anesthesia Transfer of Care Note ? ?Patient: Ernest Edwards ? ?Procedure(s) Performed: Partial First Ray Amputation, left foot (Left: Foot) ? ?Patient Location: PACU ? ?Anesthesia Type:MAC ? ?Level of Consciousness: awake, alert  and oriented ? ?Airway & Oxygen Therapy: Patient Spontanous Breathing ? ?Post-op Assessment: Report given to RN, Post -op Vital signs reviewed and stable and Patient moving all extremities X 4 ? ?Post vital signs: Reviewed and stable ? ?Last Vitals:  ?Vitals Value Taken Time  ?BP 113/73   ?Temp    ?Pulse 83 06/20/21 0825  ?Resp 14   ?SpO2 98 % 06/20/21 0825  ?Vitals shown include unvalidated device data. ? ?Last Pain:  ?Vitals:  ? 06/20/21 0717  ?TempSrc: Oral  ?PainSc:   ?   ? ?  ? ?Complications: No notable events documented. ?

## 2021-06-20 NOTE — Anesthesia Procedure Notes (Signed)
Procedure Name: [place] ?Date/Time: 06/20/2021 7:40 AM ?Performed by: Harden Mo, CRNA ?Pre-anesthesia Checklist: Patient identified, Emergency Drugs available, Suction available and Patient being monitored ?Patient Re-evaluated:Patient Re-evaluated prior to induction ?Oxygen Delivery Method: Simple face mask ?Preoxygenation: Pre-oxygenation with 100% oxygen ?Induction Type: IV induction ?Placement Confirmation: positive ETCO2 and breath sounds checked- equal and bilateral ?Dental Injury: Teeth and Oropharynx as per pre-operative assessment  ? ? ? ? ?

## 2021-06-21 ENCOUNTER — Encounter (HOSPITAL_COMMUNITY): Payer: Self-pay | Admitting: Podiatry

## 2021-06-21 DIAGNOSIS — L97529 Non-pressure chronic ulcer of other part of left foot with unspecified severity: Secondary | ICD-10-CM

## 2021-06-21 LAB — GLUCOSE, CAPILLARY
Glucose-Capillary: 171 mg/dL — ABNORMAL HIGH (ref 70–99)
Glucose-Capillary: 249 mg/dL — ABNORMAL HIGH (ref 70–99)
Glucose-Capillary: 273 mg/dL — ABNORMAL HIGH (ref 70–99)
Glucose-Capillary: 230 mg/dL — ABNORMAL HIGH (ref 70–99)

## 2021-06-21 LAB — BASIC METABOLIC PANEL WITH GFR
Anion gap: 7 (ref 5–15)
BUN: 15 mg/dL (ref 6–20)
CO2: 25 mmol/L (ref 22–32)
Calcium: 8.5 mg/dL — ABNORMAL LOW (ref 8.9–10.3)
Chloride: 106 mmol/L (ref 98–111)
Creatinine, Ser: 1.19 mg/dL (ref 0.61–1.24)
GFR, Estimated: >60 mL/min
Glucose, Bld: 171 mg/dL — ABNORMAL HIGH (ref 70–99)
Potassium: 3.9 mmol/L (ref 3.5–5.1)
Sodium: 138 mmol/L (ref 135–145)

## 2021-06-21 LAB — CBC
HCT: 29.7 % — ABNORMAL LOW (ref 39.0–52.0)
Hemoglobin: 9.7 g/dL — ABNORMAL LOW (ref 13.0–17.0)
MCH: 26.3 pg (ref 26.0–34.0)
MCHC: 32.7 g/dL (ref 30.0–36.0)
MCV: 80.5 fL (ref 80.0–100.0)
Platelets: 342 10*3/uL (ref 150–400)
RBC: 3.69 MIL/uL — ABNORMAL LOW (ref 4.22–5.81)
RDW: 13.3 % (ref 11.5–15.5)
WBC: 5.7 10*3/uL (ref 4.0–10.5)
nRBC: 0 % (ref 0.0–0.2)

## 2021-06-21 MED ORDER — CHLORTHALIDONE 25 MG PO TABS
25.0000 mg | ORAL_TABLET | Freq: Every day | ORAL | Status: DC
  Administered 2021-06-21 – 2021-06-22 (×2): 25 mg via ORAL
  Filled 2021-06-21 (×2): qty 1

## 2021-06-21 NOTE — Progress Notes (Signed)
PROGRESS NOTE  Ernest Edwards [id number] DOB: March 13, 1961 DOA: 06/17/2021 PCP: [facility]   LOS: 3 days   Brief Narrative / Interim history: 61 year old male with history of DM2, HTN, HLD who comes into the hospital with a chronic left foot diabetic ulcer, nonhealing, with high suspicion for osteomyelitis.  He was seen in podiatry office yesterday, felt to have worsening of his wound and he was sent to the hospital for antibiotics, MRI, ABIs, ID consult.  Podiatry, ID, vascular surgery consulted.   Subjective / 24h Interval events: Some discomfort in his left foot this morning, but no severe pain.  Worked well with PT yesterday using the walker.  Assesement and Plan: Principal Problem:   Toe osteomyelitis, left (HCC) Active Problems:   Diabetic foot ulcer ([place])   Uncontrolled type 2 diabetes mellitus with hyperglycemia, with long-term current use of insulin (HCC)   AKI (acute kidney injury) ([place])   Essential hypertension   Dyslipidemia   Obesity, Class II, BMI 35-39.9   Assessment and Plan: Principal problem Toe osteomyelitis-patient was admitted to the hospital from podiatry office.  Underwent an MRI of the left foot on admission on 3/3 which showed a large soft tissue ulcer at the plantar base of the first MTP, also septic arthritis with osteomyelitis of the first proximal phalanx, first metatarsal and hallux sesamoids, as well as a 1.5 x 0.8 x 1.4 abscess medial to the first metatarsal head.  Vascular also consulted due to noncompressive bilateral lower extremity arteries, but given good pulses/waveforms no current interventions planned as he appears to have adequate blood flow for healing. Podiatry consulted, he was taken to the [facility] on 3/5 and he is status post partial first ray amputation on the left foot.  Operative report mentions that bone margins were clean on gross examination.  Discussed with ID today, would like to continue to monitor in the inpatient setting  awaiting cultures, to determine antibiotic choice upon discharge.  Active problems Type 2 diabetes mellitus, uncontrolled, with hyperglycemia -continue insulin, sliding scale, continue to monitor CBGs as below  CBG (last 3)  Recent Labs    06/20/21 1706 06/20/21 1948 06/21/21 0811  GLUCAP 272* 249* 171*    Acute kidney injury-resolved with fluids, creatinine remains normal this morning   Essential hypertension-continue Norvasc, resume home chlorthalidone given persistent high blood pressure   Hyperlipidemia-continue statin  Anemia of chronic illness-hemoglobin stable this morning, no bleeding   Scheduled Meds:  amLODipine  10 mg Oral Daily   atorvastatin  80 mg Oral Daily   Chlorhexidine Gluconate Cloth  6 each Topical Q0600   enoxaparin (LOVENOX) injection  40 mg Subcutaneous Q24H   insulin glargine-yfgn  40 Units Subcutaneous QHS   mupirocin ointment  1 application. Nasal BID   Continuous Infusions:  sodium chloride     sodium chloride 100 mL/hr at 06/21/21 0000    ceFAZolin (ANCEF) IV 2 g (06/21/21 0548)   PRN Meds:.acetaminophen **OR** acetaminophen, magnesium hydroxide, morphine injection, ondansetron **OR** ondansetron (ZOFRAN) IV, traZODone  Diet Orders (From admission, onward)     Start     Ordered   06/20/21 0911  Diet Carb Modified Fluid consistency: Thin; Room service appropriate? Yes  Diet effective now       Question Answer Comment  Diet-HS Snack? Nothing   Calorie Level Medium 1600-2000   Fluid consistency: Thin   Room service appropriate? Yes      06/20/21 0910  DVT prophylaxis: enoxaparin (LOVENOX) injection 40 mg Start: 06/18/21 1000   Lab Results  Component Value Date   PLT 342 06/21/2021      Code Status: Full Code  Family Communication: no family at bedside  Status is: Inpatient  Remains inpatient appropriate because: ID recommends inpatient monitoring until cultures are back  Level of care: Med-Surg  Consultants:   Podiatry Vascular ID  Procedures:  First ray amputation, left  Microbiology  Intraoperative cultures 3/5-NGTD  Antimicrobials: Daptomycin / Ceftriaxone 3/3 >>    Objective: Vitals:   06/20/21 2320 06/20/21 2331 06/21/21 0556 06/21/21 0731  BP: (!) 185/115 (!) 154/98 (!) 146/97 (!) 161/92  Pulse: 77  74 81  Resp: 18  18 16   Temp: 97.9 F (36.6 C)  98.2 F (36.8 C) 98.1 F (36.7 C)  TempSrc: Oral  Oral Oral  SpO2: 100%  100% 100%  Weight:      Height:        Intake/Output Summary (Last 24 hours) at 06/21/2021 1016 Last data filed at 06/21/2021 [id number] Gross per 24 hour  Intake 2400 ml  Output 2230 ml  Net 170 ml    Wt Readings from Last 3 Encounters:  06/20/21 129.3 kg  02/26/21 124.7 kg  02/22/14 104.3 kg    Examination:  Constitutional: NAD Eyes: lids and conjunctivae normal, no scleral icterus ENMT: mmm Neck: normal, supple Respiratory: clear to auscultation bilaterally, no wheezing, no crackles. Normal respiratory effort.  Cardiovascular: Regular rate and rhythm, no murmurs / rubs / gallops. No LE edema. Abdomen: soft, no distention, no tenderness. Bowel sounds positive.  Skin: no rashes Neurologic: no focal deficits, equal strength   Data Reviewed: I have independently reviewed following labs and imaging studies  CBC Recent Labs  Lab 06/17/21 1757 06/18/21 0455 06/19/21 0449 06/21/21 0044  WBC 7.8 6.5 4.8 5.7  HGB 11.1* 10.2* 10.4* 9.7*  HCT 34.5* 31.1* 31.2* 29.7*  PLT 441* 386 367 342  MCV 80.6 81.0 79.6* 80.5  MCH 25.9* 26.6 26.5 26.3  MCHC 32.2 32.8 33.3 32.7  RDW 13.5 13.4 13.5 13.3  LYMPHSABS 1.6  --   --   --   MONOABS 0.7  --   --   --   EOSABS 0.2  --   --   --   BASOSABS 0.0  --   --   --      Recent Labs  Lab 06/17/21 1757 06/18/21 0455 06/18/21 1611 06/19/21 0449 06/21/21 0044  NA 132* 138  --  137 138  K 3.9 3.7  --  3.7 3.9  CL 98 101  --  103 106  CO2 22 26  --  24 25  GLUCOSE 164* 172*  --  108* 171*  BUN 27*  20  --  13 15  CREATININE 1.35* 1.19  --  1.08 1.19  CALCIUM 9.3 9.0  --  8.8* 8.5*  AST  --   --   --  18  --   ALT  --   --   --  22  --   ALKPHOS  --   --   --  53  --   BILITOT  --   --   --  0.3  --   ALBUMIN  --   --   --  2.6*  --   CRP  --   --  10.2*  --   --   PROCALCITON  --  <0.10  --   --   --  INR  --  1.3*  --   --   --      ------------------------------------------------------------------------------------------------------------------ No results for input(s): CHOL, HDL, LDLCALC, TRIG, CHOLHDL, LDLDIRECT in the last 72 hours.  No results found for: HGBA1C ------------------------------------------------------------------------------------------------------------------ No results for input(s): TSH, T4TOTAL, T3FREE, THYROIDAB in the last 72 hours.  Invalid input(s): FREET3  Cardiac Enzymes No results for input(s): CKMB, TROPONINI, MYOGLOBIN in the last 168 hours.  Invalid input(s): CK ------------------------------------------------------------------------------------------------------------------ No results found for: BNP  CBG: Recent Labs  Lab 06/20/21 0829 06/20/21 1216 06/20/21 1706 06/20/21 1948 06/21/21 0811  GLUCAP 131* 146* 272* 249* 171*     Recent Results (from the past 240 hour(s))  WOUND CULTURE     Status: Abnormal   Collection Time: 06/17/21  3:41 PM   Specimen: Ulcer; Wound  Result Value Ref Range Status   MICRO NUMBER: [id number]  Final   SPECIMEN QUALITY: Adequate  Final   SOURCE: WOUND (SITE NOT SPECIFIED)  Final   STATUS: FINAL  Final   GRAM STAIN:   Final    No white blood cells seen No epithelial cells seen No organisms seen   ISOLATE 1: Staphylococcus aureus (A)  Final    Comment: Light growth of Staphylococcus aureus      Susceptibility   Staphylococcus aureus - AEROBIC CULT, GRAM STAIN POSITIVE 1    VANCOMYCIN 1 Sensitive     CIPROFLOXACIN <=0.5 Sensitive     CLINDAMYCIN <=0.25 Sensitive     LEVOFLOXACIN <=0.12  Sensitive     ERYTHROMYCIN <=0.25 Sensitive     GENTAMICIN <=0.5 Sensitive     OXACILLIN* <=0.25 Sensitive      * Oxacillin susceptible staphylococci are susceptible to other penicillinase-stable penicillins (e.g., methicillin, nafcillin), beta- lactam/beta-lactamase inhibitor combinations, and cephems with staphylococcal indications, including cefazolin.     TETRACYCLINE <=1 Sensitive     TRIMETH/SULFA* <=10 Sensitive      * Oxacillin susceptible staphylococci are susceptible to other penicillinase-stable penicillins (e.g., methicillin, nafcillin), beta- lactam/beta-lactamase inhibitor combinations, and cephems with staphylococcal indications, including cefazolin. Legend: S = Susceptible  I = Intermediate R = Resistant  NS = Not susceptible * = Not tested  NR = Not reported **NN = See antimicrobic comments   Resp Panel by RT-PCR (Flu A&B, Covid) Nasopharyngeal Swab     Status: None   Collection Time: 06/17/21 11:36 PM   Specimen: Nasopharyngeal Swab; Nasopharyngeal(NP) swabs in vial transport medium  Result Value Ref Range Status   SARS Coronavirus 2 by RT PCR NEGATIVE NEGATIVE Final    Comment: (NOTE) SARS-CoV-2 target nucleic acids are NOT DETECTED.  The SARS-CoV-2 RNA is generally detectable in upper respiratory specimens during the acute phase of infection. The lowest concentration of SARS-CoV-2 viral copies this assay can detect is 138 copies/mL. A negative result does not preclude SARS-Cov-2 infection and should not be used as the sole basis for treatment or other patient management decisions. A negative result may occur with  improper specimen collection/handling, submission of specimen other than nasopharyngeal swab, presence of viral mutation(s) within the areas targeted by this assay, and inadequate number of viral copies(<138 copies/mL). A negative result must be combined with clinical observations, patient history, and epidemiological information. The expected  result is Negative.  Fact Sheet for Patients:  EntrepreneurPulse.com.au  Fact Sheet for Healthcare Providers:  IncredibleEmployment.be  This test is no t yet approved or cleared by the [place] FDA and  has been authorized for detection and/or diagnosis of SARS-CoV-2 by FDA under  an Emergency Use Authorization (EUA). This EUA will remain  in effect (meaning this test can be used) for the duration of the COVID-19 declaration under Section 564(b)(1) of the Act, 21 U.S.C.section 360bbb-3(b)(1), unless the authorization is terminated  or revoked sooner.       Influenza A by PCR NEGATIVE NEGATIVE Final   Influenza B by PCR NEGATIVE NEGATIVE Final    Comment: (NOTE) The Xpert Xpress SARS-CoV-2/FLU/RSV plus assay is intended as an aid in the diagnosis of influenza from Nasopharyngeal swab specimens and should not be used as a sole basis for treatment. Nasal washings and aspirates are unacceptable for Xpert Xpress SARS-CoV-2/FLU/RSV testing.  Fact Sheet for Patients: EntrepreneurPulse.com.au  Fact Sheet for Healthcare Providers: IncredibleEmployment.be  This test is not yet approved or cleared by the [place] FDA and has been authorized for detection and/or diagnosis of SARS-CoV-2 by FDA under an Emergency Use Authorization (EUA). This EUA will remain in effect (meaning this test can be used) for the duration of the COVID-19 declaration under Section 564(b)(1) of the Act, 21 U.S.C. section 360bbb-3(b)(1), unless the authorization is terminated or revoked.  Performed at [facility], [place] [address], [place], [place] [address]   Surgical pcr screen     Status: Abnormal   Collection Time: 06/18/21  7:48 PM   Specimen: Nasal Mucosa; Nasal Swab  Result Value Ref Range Status   MRSA, PCR NEGATIVE NEGATIVE Final   Staphylococcus aureus POSITIVE (A) NEGATIVE Final    Comment: (NOTE) The Xpert SA  Assay (FDA approved for NASAL specimens in patients 8 years of age and older), is one component of a comprehensive surveillance program. It is not intended to diagnose infection nor to guide or monitor treatment. Performed at [facility], [place] [address], [place], [place] [address]   Aerobic/Anaerobic Culture w Gram Stain (surgical/deep wound)     Status: None (Preliminary result)   Collection Time: 06/20/21  8:01 AM   Specimen: PATH Other; Body Fluid  Result Value Ref Range Status   Specimen Description ABSCESS  Final   Special Requests LEFT FOOT SPEC A  Final   Gram Stain   Final    FEW WBC PRESENT, PREDOMINANTLY MONONUCLEAR NO ORGANISMS SEEN Performed at [facility], [address], [place], [place] [address]    Culture PENDING  Incomplete   Report Status PENDING  Incomplete  Aerobic/Anaerobic Culture w Gram Stain (surgical/deep wound)     Status: None (Preliminary result)   Collection Time: 06/20/21  8:02 AM   Specimen: PATH Bone biopsy; Tissue  Result Value Ref Range Status   Specimen Description BONE  Final   Special Requests LEFT FOOT CLEAN MARGIN SPEC 2  Final   Gram Stain   Final    RARE WBC PRESENT, PREDOMINANTLY MONONUCLEAR NO ORGANISMS SEEN Performed at [facility], [address] [place], [place] [address]    Culture PENDING  Incomplete   Report Status PENDING  Incomplete      Radiology Studies: No results found.   Marzetta Board, MD, PhD [facility]  Between 7 am - 7 pm I am available, please contact me via Amion (for emergencies) or Securechat (non urgent messages)  Between 7 pm - 7 am I am not available, please contact night coverage MD/APP via Amion

## 2021-06-21 NOTE — Progress Notes (Signed)
Subjective: ?POD # 1 s/p left partial first ray amputation.  States he is feeling well.  Denies any fevers or chills.  No significant pain. ? ?Objective: ?AAO x3, NAD ?Bandage clean, dry, intact without any strikethrough. ?No pain with calf compression, swelling, warmth, erythema ? ?Assessment: ?POD # 1 s/p left partial first amputation ? ?Plan: ?WBC 5.7, afebrile. Micro from surgery yesterday still pending but so far no growth. Awaiting final culture results for antibiotic recommendations from ID.  We will plan on changing the dressing tomorrow if needed.  Nonweightbearing left lower extremity with surgical shoe. ? ?Celesta Gentile, DPM ? ?

## 2021-06-21 NOTE — Progress Notes (Signed)
PT Cancellation Note ? ?Patient Details ?Name: Ernest Edwards ?MRN: [id number] ?DOB: 29-Nov-1960 ? ? ?Cancelled Treatment:    Reason Eval/Treat Not Completed: Other (comment).  Talked with pt about using the stairs and the flight, but assures PT he is fine and does not need help.  Declines to walk with PT stating he has already walked today.  Declines exercises as he "has a plan already".  Follow up at another time. ? ? ?Ramond Dial ?06/21/2021, 10:50 AM ? ?Mee Hives, PT PhD ?Acute Rehab Dept. Number: [facility] [id number] and [place] [phone] ? ?

## 2021-06-21 NOTE — Progress Notes (Signed)
[facility] Infectious Diseases Follow Up Note  Patient Identification: Patient Name: Ernest Edwards MRN: [id number] [place] Date: 06/17/2021  5:21 PM Age: 61 y.o.Today's Date: 06/21/2021   Reason for Visit: Osteomyelitis  Principal Problem:   Toe osteomyelitis, left ([place]) Active Problems:   Diabetic foot ulcer ([place])   Uncontrolled type 2 diabetes mellitus with hyperglycemia, with long-term current use of insulin ([facility])   Essential hypertension   AKI (acute kidney injury) ([place])   Dyslipidemia   Obesity, Class II, BMI 35-39.9  Antibiotics: Vancomycin 3/2, Daptomycin 3/3-c                    Cefepime 3/2, ceftriaxone 3/3-c                    Metronidazole 3/2   Lines/Hardware: PIV    Interval Events: remains afebrile, no leukocytosis, s/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  The clean margins were hard indurated and without any signs of bone infection  # DM # Medication Monitoring     Recommendations Continue cefazolin as is for now Fu OR cultures and path, may need PICC line and IV abtx if OR cultures or path or both positive BG control  Fu CBC and BMP Discussed with patient and primary   Rest of the management as per the primary team. Thank you for the consult. Please page with pertinent questions or concerns.  ______________________________________________________________________ Subjective patient seen and examined at the bedside. No complaints and eager to go home.  Denies nausea and vomiting   Vitals BP (!) 161/92 (BP Location: Left Arm)    Pulse 81    Temp 98.1 F  (36.7 C) (Oral)    Resp 16    Ht 6' 2.02" (1.88 m)    Wt 129.3 kg    SpO2 100%    BMI 36.58 kg/m     Physical Exam Constitutional:  sitting up in the bed, comfortable     Comments:   Cardiovascular:     Rate and Rhythm: Normal rate and regular rhythm.     Heart sounds:  Pulmonary:     Effort: Pulmonary effort is normal.     Comments:   Abdominal:     Palpations: Abdomen is soft.     Tenderness: non distended   Musculoskeletal:        General: Left foot is bandaged  Skin:    Comments:   Neurological:     General: Grossly non-focal, awake, alert and oriented *3  Psychiatric:        Mood and Affect: Mood normal.   Pertinent Microbiology Results for orders placed or performed during the hospital encounter of 06/17/21  Resp Panel by RT-PCR (Flu A&B, Covid) Nasopharyngeal Swab     Status: None   Collection Time: 06/17/21 11:36 PM   Specimen: Nasopharyngeal Swab; Nasopharyngeal(NP) swabs in vial transport medium  Result Value Ref Range Status   SARS Coronavirus 2 by RT PCR NEGATIVE NEGATIVE Final    Comment: (NOTE) SARS-CoV-2 target nucleic acids are NOT DETECTED.  The SARS-CoV-2 RNA is generally detectable in upper respiratory specimens during the acute phase of infection. The lowest  concentration of SARS-CoV-2 viral copies this assay can detect is 138 copies/mL. A negative result does not preclude SARS-Cov-2 infection and should not be used as the sole basis for treatment or other patient management decisions. A negative result may occur with  improper specimen collection/handling, submission of specimen other than nasopharyngeal swab, presence of viral mutation(s) within the areas targeted by this assay, and inadequate number of viral copies(<138 copies/mL). A negative result must be combined with clinical observations, patient history, and epidemiological information. The expected result is Negative.  Fact Sheet for Patients:   EntrepreneurPulse.com.au  Fact Sheet for Healthcare Providers:  IncredibleEmployment.be  This test is no t yet approved or cleared by the [place] FDA and  has been authorized for detection and/or diagnosis of SARS-CoV-2 by FDA under an Emergency Use Authorization (EUA). This EUA will remain  in effect (meaning this test can be used) for the duration of the COVID-19 declaration under Section 564(b)(1) of the Act, 21 U.S.C.section 360bbb-3(b)(1), unless the authorization is terminated  or revoked sooner.       Influenza A by PCR NEGATIVE NEGATIVE Final   Influenza B by PCR NEGATIVE NEGATIVE Final    Comment: (NOTE) The Xpert Xpress SARS-CoV-2/FLU/RSV plus assay is intended as an aid in the diagnosis of influenza from Nasopharyngeal swab specimens and should not be used as a sole basis for treatment. Nasal washings and aspirates are unacceptable for Xpert Xpress SARS-CoV-2/FLU/RSV testing.  Fact Sheet for Patients: EntrepreneurPulse.com.au  Fact Sheet for Healthcare Providers: IncredibleEmployment.be  This test is not yet approved or cleared by the [place] FDA and has been authorized for detection and/or diagnosis of SARS-CoV-2 by FDA under an Emergency Use Authorization (EUA). This EUA will remain in effect (meaning this test can be used) for the duration of the COVID-19 declaration under Section 564(b)(1) of the Act, 21 U.S.C. section 360bbb-3(b)(1), unless the authorization is terminated or revoked.  Performed at [facility], [place] [address], [place], [place] [address]   Surgical pcr screen     Status: Abnormal   Collection Time: 06/18/21  7:48 PM   Specimen: Nasal Mucosa; Nasal Swab  Result Value Ref Range Status   MRSA, PCR NEGATIVE NEGATIVE Final   Staphylococcus aureus POSITIVE (A) NEGATIVE Final    Comment: (NOTE) The Xpert SA Assay (FDA approved for NASAL specimens in  patients 56 years of age and older), is one component of a comprehensive surveillance program. It is not intended to diagnose infection nor to guide or monitor treatment. Performed at [facility], [place] [address], [place], [place] [address]   Aerobic/Anaerobic Culture w Gram Stain (surgical/deep wound)     Status: None (Preliminary result)   Collection Time: 06/20/21  8:01 AM   Specimen: PATH Other; Body Fluid  Result Value Ref Range Status   Specimen Description ABSCESS  Final   Special Requests LEFT FOOT SPEC A  Final   Gram Stain   Final    FEW WBC PRESENT, PREDOMINANTLY MONONUCLEAR NO ORGANISMS SEEN Performed at [facility], [address] [place], [place] [address]    Culture PENDING  Incomplete   Report Status PENDING  Incomplete  Aerobic/Anaerobic Culture w Gram Stain (surgical/deep wound)     Status: None (Preliminary result)   Collection Time: 06/20/21  8:02 AM   Specimen: PATH Bone biopsy; Tissue  Result Value Ref Range Status   Specimen Description BONE  Final   Special Requests LEFT FOOT CLEAN MARGIN SPEC 2  Final   Gram Stain   Final    RARE WBC PRESENT, PREDOMINANTLY MONONUCLEAR NO ORGANISMS SEEN Performed at [facility], [address] [place], [place] [address]    Culture PENDING  Incomplete   Report Status PENDING  Incomplete    Pertinent Lab. CBC Latest Ref Rng & Units 06/21/2021 06/19/2021 06/18/2021  WBC 4.0 - 10.5 K/uL 5.7 4.8 6.5  Hemoglobin 13.0 - 17.0 g/dL 9.7(L) 10.4(L) 10.2(L)  Hematocrit 39.0 - 52.0 % 29.7(L) 31.2(L) 31.1(L)  Platelets 150 - 400 K/uL 342 367 386   CMP Latest Ref Rng & Units 06/21/2021 06/19/2021 06/18/2021  Glucose 70 - 99 mg/dL 171(H) 108(H) 172(H)  BUN 6 - 20 mg/dL 15 13 20   Creatinine 0.61 - 1.24 mg/dL 1.19 1.08 1.19  Sodium 135 - 145 mmol/L 138 137 138  Potassium 3.5 - 5.1 mmol/L 3.9 3.7 3.7  Chloride 98 - 111 mmol/L 106 103 101  CO2 22 - 32 mmol/L 25 24 26   Calcium 8.9 - 10.3 mg/dL 8.5(L) 8.8(L) 9.0  Total  Protein 6.5 - 8.1 g/dL - 6.9 -  Total Bilirubin 0.3 - 1.2 mg/dL - 0.3 -  Alkaline Phos 38 - 126 U/L - 53 -  AST 15 - 41 U/L - 18 -  ALT 0 - 44 U/L - 22 -     Pertinent Imaging today Plain films and CT images have been personally visualized and interpreted; radiology reports have been reviewed. Decision making incorporated into the Impression / Recommendations.  DG Foot Complete Left  Result Date: 06/20/2021 CLINICAL DATA:  Status post surgery. EXAM: LEFT FOOT - COMPLETE 3+ VIEW COMPARISON:  06/17/2021 FINDINGS: Interval transmetatarsal amputation of the first metatarsal through the proximal shaft. Prior transmetatarsal amputation of the fifth metatarsal through the mid diaphysis. Surgical staples along the medial aspect of the forefoot. No periosteal reaction or bone destruction. No acute fracture or dislocation. Small plantar calcaneal spur. Degenerative changes of the navicular-cuneiform joints. IMPRESSION: 1. Interval transmetatarsal amputation of the first metatarsal through the proximal shaft. 2. Prior transmetatarsal amputation of the fifth metatarsal through the mid diaphysis. Surgical staples along the medial aspect of the forefoot. Electronically Signed   By: Kathreen Devoid M.D.   On: 06/20/2021 09:13    I spent more than 35 minutes for this patient encounter including review of prior medical records, coordination of care with primary/other specialist with greater than 50% of time being face to face/counseling and discussing diagnostics/treatment plan with the patient/family.  Electronically signed by:   Rosiland Oz, MD Infectious Disease Physician [facility] Pager: [phone]

## 2021-06-22 LAB — SURGICAL PATHOLOGY

## 2021-06-22 LAB — GLUCOSE, CAPILLARY
Glucose-Capillary: 134 mg/dL — ABNORMAL HIGH (ref 70–99)
Glucose-Capillary: 231 mg/dL — ABNORMAL HIGH (ref 70–99)

## 2021-06-22 MED ORDER — ORITAVANCIN DIPHOSPHATE 400 MG IV SOLR
1200.0000 mg | Freq: Once | INTRAVENOUS | Status: AC
  Administered 2021-06-22: 1200 mg via INTRAVENOUS
  Filled 2021-06-22: qty 120

## 2021-06-22 MED ORDER — OXYCODONE HCL 5 MG PO TABS: 5.0000 mg | ORAL_TABLET | Freq: Three times a day (TID) | ORAL | 0 refills | Status: DC | PRN

## 2021-06-22 NOTE — Progress Notes (Signed)
Subjective: ?POD # 2 s/p left partial first ray amputation.  States he is doing well today.  No fevers or chills.  No significant pain. ? ?Objective: ?AAO x3, NAD ?Bandage clean, dry, intact without any strikethrough.  Status post partial first amputation.  Incisions healing well and sutures, staples intact without any dehiscence.  There is no range of motion there is no active bleeding.  No increased temperature to the foot and no significant cellulitis. ?No pain with calf compression, swelling, warmth, erythema ? ? ? ? ? ? ?Assessment: ?POD # 2 s/p left partial first amputation ? ?Plan: ?Dressing changed today.  I cleansed the wound with saline.  Xeroform was applied followed by dry sterile dressing.  He can leave dressing clean, dry, intact until follow-up with Dr. Allena Katz which is scheduled.  He is nonweightbearing but he can put weight on his heel only for stairs to help assist in balance but otherwise he stay off the foot is much as possible and stressed the importance of this.  Antibiotics per infectious disease and he is able to be discharged once this is arranged.  Return precautions discussed. ? ?Ovid Curd, DPM ? ?

## 2021-06-22 NOTE — Progress Notes (Signed)
Orthopedic Tech Progress Note ?Patient Details:  ?Ernest Edwards ?Apr 16, 1961 ?[id number] ? ?Ortho Devices ?Type of Ortho Device: CAM walker ?Ortho Device/Splint Location: RLE ?Ortho Device/Splint Interventions: Ordered, Application, Adjustment ?  ?Post Interventions ?Patient Tolerated: Well ?Instructions Provided: Care of device ? ?[name] ?06/22/2021, 3:47 PM ? ?

## 2021-06-22 NOTE — Progress Notes (Signed)
Mobility Specialist Progress Note: ? ? 06/22/21 1154  ?Mobility  ?Activity Ambulated with assistance in room  ?Level of Assistance Standby assist, set-up cues, supervision of patient - no hands on  ?Assistive Device Front wheel walker  ?LLE Weight Bearing NWB  ?Distance Ambulated (ft) 6 ft  ?Activity Response Tolerated well  ?$Mobility charge 1 Mobility  ? ?Pt received in bed. Ambulated to chair to get wheeled to stairs for stair trials. Left in chair with call bell in reach and all needs met.  ? ?Ernest Edwards ?Mobility Specialist ?Primary Phone [phone] ? ?

## 2021-06-22 NOTE — Discharge Summary (Signed)
Physician Discharge Summary  Ernest Edwards QMG:[id number] DOB: April 06, 1961 DOA: 06/17/2021  PCP: [facility]  Admit date: 06/17/2021 Discharge date: 06/22/2021  Admitted From: home Disposition:  home  Recommendations for Outpatient Follow-up:  Follow up with Podiatry in 1 week ID will follow on final wound cultures  Home Health: none Equipment/Devices: none  Discharge Condition: stable CODE STATUS: Full code Diet recommendation: diabetic   HPI: Per admitting MD, Ernest Edwards is a 61 y.o. African-American male with medical history significant for type 2 diabetes mellitus and hypertension and dyslipidemia, who presented to the emergency room with acute onset of suspected left foot diabetic ulcer infection with osteomyelitis.  The patient was seen today at the foot and ankle clinic.  He has been managed for diabetic foot wound and is currently taking 5 days of p.o. doxycycline.  Follow-up visit today was concerning for possible osteomyelitis of the first MTP joint by x-ray done at the office.  The patient denies any fever or chills.  No nausea or vomiting or abdominal pain.  No chest pain or dyspnea or palpitations.  No cough or wheezing or hemoptysis.  No bleeding diathesis.  He has been having elevated blood glucose levels above 200.  Hospital Course / Discharge diagnoses: Principal Problem:   Toe osteomyelitis, left (HCC) Active Problems:   Diabetic foot ulcer (HCC)   Uncontrolled type 2 diabetes mellitus with hyperglycemia, with long-term current use of insulin (HCC)   AKI (acute kidney injury) (HCC)   Essential hypertension   Dyslipidemia   Obesity, Class II, BMI 35-39.9   Assessment and Plan: Principal problem Toe osteomyelitis-patient was admitted to the hospital from podiatry office.  Underwent an MRI of the left foot on admission on 3/3 which showed a large soft tissue ulcer at the plantar base of the first MTP, also septic arthritis with osteomyelitis of  the first proximal phalanx, first metatarsal and hallux sesamoids, as well as a 1.5 x 0.8 x 1.4 abscess medial to the first metatarsal head.  Vascular also consulted due to noncompressive bilateral lower extremity arteries, but given good pulses/waveforms no current interventions planned as he appears to have adequate blood flow for healing. 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  Operative report mentions that bone margins were clean on gross examination.  ID consulted and followed patient while hospitalized.  He he has remained stable, afebrile, was maintained on IV antibiotics while here, and was given a dose of oritavancin and will be discharged home in stable condition.  Intraoperative cultures are pending at the time of discharge, ID will follow on results and decide versus further oral versus IV antibiotics in the next few days.   Active problems Type 2 diabetes mellitus, uncontrolled, with hyperglycemia -continue home regimen Acute kidney injury-resolved with fluids Essential hypertension-continue home regimen  Hyperlipidemia-continue statin Anemia of chronic illness-hemoglobin stable, no bleeding Obesity, class II-BMI 36, he would benefit from weight loss   Sepsis ruled out   Discharge Instructions   Allergies as of 06/22/2021   No Known Allergies      Medication List     STOP taking these medications    doxycycline 100 MG capsule Commonly known as: VIBRAMYCIN       TAKE these medications    amLODipine 10 MG tablet Commonly known as: NORVASC Take 10 mg by mouth daily. What changed: Another medication with the same name was  removed. Continue taking this medication, and follow the directions you see here.   atorvastatin 80 MG tablet Commonly known as: LIPITOR Take 80 mg by mouth daily.   chlorthalidone 25 MG tablet Commonly known as: HYGROTON Take 1 tablet (25 mg total) by mouth daily.    insulin glargine 100 UNIT/ML Solostar Pen Commonly known as: LANTUS Inject 40 Units into the skin at bedtime.   metFORMIN 500 MG 24 hr tablet Commonly known as: GLUCOPHAGE-XR Take 4 tablets (2,000 mg total) by mouth daily with breakfast.   mupirocin ointment 2 % Commonly known as: BACTROBAN Apply 1 application topically daily.   OVER THE COUNTER MEDICATION Take 1 capsule by mouth daily. CLA- body fat reduction   oxyCODONE 5 MG immediate release tablet Commonly known as: Roxicodone Take 1 tablet (5 mg total) by mouth every 8 (eight) hours as needed.   sildenafil 100 MG tablet Commonly known as: VIAGRA Take 100 mg by mouth daily as needed for erectile dysfunction.   valsartan 160 MG tablet Commonly known as: DIOVAN Take 320 mg by mouth daily.         Consultations: Podiatry Vascular surgery  ID  Procedures/Studies:  MR FOOT LEFT W WO CONTRAST  Result Date: 06/18/2021 CLINICAL DATA:  Great toe ulcer. EXAM: MRI OF THE LEFT FOREFOOT WITHOUT AND WITH CONTRAST TECHNIQUE: Multiplanar, multisequence MR imaging of the left forefoot was performed both before and after administration of intravenous contrast. CONTRAST:  10mL GADAVIST GADOBUTROL 1 MMOL/ML IV SOLN COMPARISON:  Left foot x-rays from yesterday. FINDINGS: Bones/Joint/Cartilage Prominent marrow edema and enhancement with corresponding decreased T1 signal involving the majority of the first proximal phalanx, sparing the head. Similar signal changes in the hallux sesamoids. Marrow edema and enhancement throughout the majority of the first metatarsal, sparing the base, with early decreased T1 marrow signal and cortical destruction of the head. No fracture or dislocation. Chronic flattening of the second and third metatarsal heads. Degenerative changes of the midfoot and second and third MTP joints. Small first MTP joint effusion. Ligaments Collateral ligaments are intact.  Lisfranc ligament is intact. Muscles and Tendons Flexor  hallucis longus tenosynovitis. Flexor and extensor tendons are intact. Soft tissue Large soft tissue ulcer at the plantar base of the first MTP joint communicating with the joint space (series 8, image 8). 1.5 x 0.8 x 1.4 cm thick-walled rim enhancing fluid collection medial to the first metatarsal head (series 13, image 22; series 10, image 31). No soft tissue mass. IMPRESSION: 1. Large soft tissue ulcer at the plantar base of the first MTP joint communicating with the joint space. 2. Underlying first MTP joint septic arthritis with osteomyelitis of the first proximal phalanx, first metatarsal, and hallux sesamoids. 3. 1.5 x 0.8 x 1.4 cm abscess medial to the first metatarsal head. 4. Infectious flexor hallucis longus tenosynovitis. Electronically Signed   By: Obie DredgeWilliam T Derry M.D.   On: 06/18/2021 11:14   DG Foot Complete Left  Result Date: 06/20/2021 CLINICAL DATA:  Status post surgery. EXAM: LEFT FOOT - COMPLETE 3+ VIEW COMPARISON:  06/17/2021 FINDINGS: Interval transmetatarsal amputation of the first metatarsal through the proximal shaft. Prior transmetatarsal amputation of the fifth metatarsal through the mid diaphysis. Surgical staples along the medial aspect of the forefoot. No periosteal reaction or bone destruction. No acute fracture or dislocation. Small plantar calcaneal spur. Degenerative changes of the navicular-cuneiform joints. IMPRESSION: 1. Interval transmetatarsal amputation of the first metatarsal through the proximal shaft. 2. Prior transmetatarsal amputation of the fifth metatarsal through the  mid diaphysis. Surgical staples along the medial aspect of the forefoot. Electronically Signed   By: Elige Ko M.D.   On: 06/20/2021 09:13   VAS [place] ABI WITH/WO TBI  Result Date: 06/18/2021  LOWER EXTREMITY DOPPLER STUDY Patient Name:  Ernest Edwards  Date of Exam:   06/18/2021 Medical Rec #: [id number]          Accession #:    [id number] Date of Birth: 09/14/1960           Patient Gender: M Patient  Age:   61 years Exam Location:  [facility] Procedure:      VAS [place] ABI WITH/WO TBI Referring Phys: Pamella Pert --------------------------------------------------------------------------------  Indications: Ulceration left great toe. High Risk Factors: Hypertension, Diabetes.  Limitations: Today's exam was limited due to unable to obtain left great toe              pressure. Comparison Study: no prior Performing Technologist: [place] Ponder RVS  Examination Guidelines: A complete evaluation includes at minimum, Doppler waveform signals and systolic blood pressure reading at the level of bilateral brachial, anterior tibial, and posterior tibial arteries, when vessel segments are accessible. Bilateral testing is considered an integral part of a complete examination. Photoelectric Plethysmograph (PPG) waveforms and toe systolic pressure readings are included as required and additional duplex testing as needed. Limited examinations for reoccurring indications may be performed as noted.  ABI Findings: +---------+------------------+-----+---------+--------+  Right     Rt Pressure (mmHg) Index Waveform  Comment   +---------+------------------+-----+---------+--------+  Brachial  180                      triphasic           +---------+------------------+-----+---------+--------+  PTA       240                1.33  triphasic           +---------+------------------+-----+---------+--------+  DP        240                1.33  triphasic           +---------+------------------+-----+---------+--------+  Great Toe 229                1.27  Normal              +---------+------------------+-----+---------+--------+ +--------+------------------+-----+---------+-------+  Left     Lt Pressure (mmHg) Index Waveform  Comment  +--------+------------------+-----+---------+-------+  Brachial 179                      triphasic          +--------+------------------+-----+---------+-------+  PTA      252                1.40   triphasic          +--------+------------------+-----+---------+-------+  DP       245                1.36  triphasic          +--------+------------------+-----+---------+-------+ +-------+-----------+----------------+------------+------------+  ABI/TBI Today's ABI Today's TBI      Previous ABI Previous TBI  +-------+-----------+----------------+------------+------------+  Right   1.33        1.27                                        +-------+-----------+----------------+------------+------------+  Left    1.40        unable to obtain                            +-------+-----------+----------------+------------+------------+   Summary: Right: Resting right ankle-brachial index indicates noncompressible right lower extremity arteries. The right toe-brachial index is normal. Left: Resting left ankle-brachial index indicates noncompressible left lower extremity arteries. Unable to obtain great toe pressure due to great toe size and wound.  *See table(s) above for measurements and observations.  Electronically signed by Gerarda Fraction on 06/18/2021 at 7:24:37 PM.    Final      Subjective: - no chest pain, shortness of breath, no abdominal pain, nausea or vomiting.   Discharge Exam: BP (!) 142/97 (BP Location: Left Arm)    Pulse 73    Temp 98 F (36.7 C)    Resp 18    Ht 6' 2.02" (1.88 m)    Wt 129.3 kg    SpO2 100%    BMI 36.58 kg/m   General: Pt is alert, awake, not in acute distress Cardiovascular: RRR, S1/S2 +, no rubs, no gallops Respiratory: CTA bilaterally, no wheezing, no rhonchi Abdominal: Soft, NT, ND, bowel sounds + Extremities: no edema, no cyanosis   The results of significant diagnostics from this hospitalization (including imaging, microbiology, ancillary and laboratory) are listed below for reference.     Microbiology: Recent Results (from the past 240 hour(s))  WOUND CULTURE     Status: Abnormal   Collection Time: 06/17/21  3:41 PM   Specimen: Ulcer; Wound  Result Value Ref  Range Status   MICRO NUMBER: [id number]  Final   SPECIMEN QUALITY: Adequate  Final   SOURCE: WOUND (SITE NOT SPECIFIED)  Final   STATUS: FINAL  Final   GRAM STAIN:   Final    No white blood cells seen No epithelial cells seen No organisms seen   ISOLATE 1: Staphylococcus aureus (A)  Final    Comment: Light growth of Staphylococcus aureus      Susceptibility   Staphylococcus aureus - AEROBIC CULT, GRAM STAIN POSITIVE 1    VANCOMYCIN 1 Sensitive     CIPROFLOXACIN <=0.5 Sensitive     CLINDAMYCIN <=0.25 Sensitive     LEVOFLOXACIN <=0.12 Sensitive     ERYTHROMYCIN <=0.25 Sensitive     GENTAMICIN <=0.5 Sensitive     OXACILLIN* <=0.25 Sensitive      * Oxacillin susceptible staphylococci are susceptible to other penicillinase-stable penicillins (e.g., methicillin, nafcillin), beta- lactam/beta-lactamase inhibitor combinations, and cephems with staphylococcal indications, including cefazolin.     TETRACYCLINE <=1 Sensitive     TRIMETH/SULFA* <=10 Sensitive      * Oxacillin susceptible staphylococci are susceptible to other penicillinase-stable penicillins (e.g., methicillin, nafcillin), beta- lactam/beta-lactamase inhibitor combinations, and cephems with staphylococcal indications, including cefazolin. Legend: S = Susceptible  I = Intermediate R = Resistant  NS = Not susceptible * = Not tested  NR = Not reported **NN = See antimicrobic comments   Resp Panel by RT-PCR (Flu A&B, Covid) Nasopharyngeal Swab     Status: None   Collection Time: 06/17/21 11:36 PM   Specimen: Nasopharyngeal Swab; Nasopharyngeal(NP) swabs in vial transport medium  Result Value Ref Range Status   SARS Coronavirus 2 by RT PCR NEGATIVE NEGATIVE Final    Comment: (NOTE) SARS-CoV-2 target nucleic acids are NOT DETECTED.  The SARS-CoV-2 RNA is generally detectable in upper respiratory specimens during the acute phase of infection.  The lowest concentration of SARS-CoV-2 viral copies this assay can detect  is 138 copies/mL. A negative result does not preclude SARS-Cov-2 infection and should not be used as the sole basis for treatment or other patient management decisions. A negative result may occur with  improper specimen collection/handling, submission of specimen other than nasopharyngeal swab, presence of viral mutation(s) within the areas targeted by this assay, and inadequate number of viral copies(<138 copies/mL). A negative result must be combined with clinical observations, patient history, and epidemiological information. The expected result is Negative.  Fact Sheet for Patients:  BloggerCourse.comhttps://www.fda.gov/media/152166/download  Fact Sheet for Healthcare Providers:  SeriousBroker.ithttps://www.fda.gov/media/152162/download  This test is no t yet approved or cleared by the [place] FDA and  has been authorized for detection and/or diagnosis of SARS-CoV-2 by FDA under an Emergency Use Authorization (EUA). This EUA will remain  in effect (meaning this test can be used) for the duration of the COVID-19 declaration under Section 564(b)(1) of the Act, 21 U.S.C.section 360bbb-3(b)(1), unless the authorization is terminated  or revoked sooner.       Influenza A by PCR NEGATIVE NEGATIVE Final   Influenza B by PCR NEGATIVE NEGATIVE Final    Comment: (NOTE) The Xpert Xpress SARS-CoV-2/FLU/RSV plus assay is intended as an aid in the diagnosis of influenza from Nasopharyngeal swab specimens and should not be used as a sole basis for treatment. Nasal washings and aspirates are unacceptable for Xpert Xpress SARS-CoV-2/FLU/RSV testing.  Fact Sheet for Patients: BloggerCourse.comhttps://www.fda.gov/media/152166/download  Fact Sheet for Healthcare Providers: SeriousBroker.ithttps://www.fda.gov/media/152162/download  This test is not yet approved or cleared by the [place] FDA and has been authorized for detection and/or diagnosis of SARS-CoV-2 by FDA under an Emergency Use Authorization (EUA). This EUA will remain in effect  (meaning this test can be used) for the duration of the COVID-19 declaration under Section 564(b)(1) of the Act, 21 U.S.C. section 360bbb-3(b)(1), unless the authorization is terminated or revoked.  Performed at [facility], [address] [place], [place] [address]   Surgical pcr screen     Status: Abnormal   Collection Time: 06/18/21  7:48 PM   Specimen: Nasal Mucosa; Nasal Swab  Result Value Ref Range Status   MRSA, PCR NEGATIVE NEGATIVE Final   Staphylococcus aureus POSITIVE (A) NEGATIVE Final    Comment: (NOTE) The Xpert SA Assay (FDA approved for NASAL specimens in patients 322 years of age and older), is one component of a comprehensive surveillance program. It is not intended to diagnose infection nor to guide or monitor treatment. Performed at [facility], [address] [place], [place] [address]   Aerobic/Anaerobic Culture w Gram Stain (surgical/deep wound)     Status: None (Preliminary result)   Collection Time: 06/20/21  8:01 AM   Specimen: PATH Other; Body Fluid  Result Value Ref Range Status   Specimen Description ABSCESS  Final   Special Requests LEFT FOOT SPEC A  Final   Gram Stain   Final    FEW WBC PRESENT, PREDOMINANTLY MONONUCLEAR NO ORGANISMS SEEN    Culture   Final    NO GROWTH 1 DAY Performed at [facility], [address] [place], [place] [address]    Report Status PENDING  Incomplete  Aerobic/Anaerobic Culture w Gram Stain (surgical/deep wound)     Status: None (Preliminary result)   Collection Time: 06/20/21  8:02 AM   Specimen: PATH Bone biopsy; Tissue  Result Value Ref Range Status   Specimen Description BONE  Final  Special Requests LEFT FOOT CLEAN MARGIN SPEC 2  Final   Gram Stain   Final    RARE WBC PRESENT, PREDOMINANTLY MONONUCLEAR NO ORGANISMS SEEN    Culture   Final    NO GROWTH 1 DAY Performed at [facility], [address], [place], [place] [address]    Report Status PENDING  Incomplete      Labs: Basic Metabolic Panel: Recent Labs  Lab 06/17/21 1757 06/18/21 0455 06/19/21 0449 06/21/21 0044  NA 132* 138 137 138  K 3.9 3.7 3.7 3.9  CL 98 101 103 106  CO2 22 26 24 25   GLUCOSE 164* 172* 108* 171*  BUN 27* 20 13 15   CREATININE 1.35* 1.19 1.08 1.19  CALCIUM 9.3 9.0 8.8* 8.5*   Liver Function Tests: Recent Labs  Lab 06/19/21 0449  AST 18  ALT 22  ALKPHOS 53  BILITOT 0.3  PROT 6.9  ALBUMIN 2.6*   CBC: Recent Labs  Lab 06/17/21 1757 06/18/21 0455 06/19/21 0449 06/21/21 0044  WBC 7.8 6.5 4.8 5.7  NEUTROABS 5.2  --   --   --   HGB 11.1* 10.2* 10.4* 9.7*  HCT 34.5* 31.1* 31.2* 29.7*  MCV 80.6 81.0 79.6* 80.5  PLT 441* 386 367 342   CBG: Recent Labs  Lab 06/21/21 0811 06/21/21 1115 06/21/21 1639 06/21/21 2101 06/22/21 0741  GLUCAP 171* 249* 273* 230* 134*   Hgb A1c No results for input(s): HGBA1C in the last 72 hours. Lipid Profile No results for input(s): CHOL, HDL, LDLCALC, TRIG, CHOLHDL, LDLDIRECT in the last 72 hours. Thyroid function studies No results for input(s): TSH, T4TOTAL, T3FREE, THYROIDAB in the last 72 hours.  Invalid input(s): FREET3 Urinalysis    Component Value Date/Time   COLORURINE YELLOW 03/05/2014 0936   APPEARANCEUR CLEAR 03/05/2014 0936   LABSPEC 1.010 03/05/2014 0936   PHURINE 6.5 03/05/2014 0936   GLUCOSEU >1000 (A) 03/05/2014 0936   HGBUR NEGATIVE 03/05/2014 0936   BILIRUBINUR NEGATIVE 03/05/2014 0936   KETONESUR NEGATIVE 03/05/2014 0936   PROTEINUR NEGATIVE 03/05/2014 0936   UROBILINOGEN 0.2 03/05/2014 0936   NITRITE NEGATIVE 03/05/2014 0936   LEUKOCYTESUR NEGATIVE 03/05/2014 0936    FURTHER DISCHARGE INSTRUCTIONS:   Get Medicines reviewed and adjusted: Please take all your medications with you for your next visit with your Primary MD   Laboratory/radiological data: Please request your Primary MD to go over all hospital tests and procedure/radiological results at the follow up, please ask your Primary  MD to get all Hospital records sent to his/her office.   In some cases, they will be blood work, cultures and biopsy results pending at the time of your discharge. Please request that your primary care M.D. goes through all the records of your hospital data and follows up on these results.   Also Note the following: If you experience worsening of your admission symptoms, develop shortness of breath, life threatening emergency, suicidal or homicidal thoughts you must seek medical attention immediately by calling 911 or calling your MD immediately  if symptoms less severe.   You must read complete instructions/literature along with all the possible adverse reactions/side effects for all the Medicines you take and that have been prescribed to you. Take any new Medicines after you have completely understood and accpet all the possible adverse reactions/side effects.    Do not drive when taking Pain medications or sleeping medications (Benzodaizepines)   Do not take more than prescribed Pain, Sleep and Anxiety Medications. It is not  advisable to combine anxiety,sleep and pain medications without talking with your primary care practitioner   Special Instructions: If you have smoked or chewed Tobacco  in the last 2 yrs please stop smoking, stop any regular Alcohol  and or any Recreational drug use.   Wear Seat belts while driving.   Please note: You were cared for by a hospitalist during your hospital stay. Once you are discharged, your primary care physician will handle any further medical issues. Please note that NO REFILLS for any discharge medications will be authorized once you are discharged, as it is imperative that you return to your primary care physician (or establish a relationship with a primary care physician if you do not have one) for your post hospital discharge needs so that they can reassess your need for medications and monitor your lab values.  Time coordinating discharge: 40  minutes  SIGNED:  Pamella Pert, MD, PhD 06/22/2021, 9:39 AM

## 2021-06-22 NOTE — Progress Notes (Signed)
[facility] Infectious Diseases Follow Up Note  Patient Identification: Patient Name: Ernest Edwards MRN: [id number] Admit Date: 06/17/2021  5:21 PM Age: 61 y.o.Today's Date: 06/22/2021   Reason for Visit: Osteomyelitis  Principal Problem:   Toe osteomyelitis, left [facility]) Active Problems:   Diabetic foot ulcer (HCC)   Uncontrolled type 2 diabetes mellitus with hyperglycemia, with long-term current use of insulin (HCC)   Essential hypertension   AKI (acute kidney injury) (HCC)   Dyslipidemia   Obesity, Class II, BMI 35-39.9  Antibiotics: Vancomycin 3/2, Daptomycin 3/3-3/4, cefazolin 3/5-c                    Cefepime 3/2, ceftriaxone 3/3-c                    Metronidazole 3/2   Lines/Hardware: PIV    Interval Events: remains afebrile, no leukocytosis, s/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  The clean margins were hard indurated and without any signs of bone infection  # DM # Medication Monitoring   Comments: I spoke with path, the path results of clean margin might not be back until tomorrow as the specimen was submitted on the weekend. Discussed with him about one dose of long acting oritavancin today before discharge vs wait for path results.cultures and decide on abtx. He prefers to get long acting and also avoid picc line if possible     Recommendations One dose of oritavancin today  Fu OR cultures and path to completion and assess for need for further abtx BG control  Fu with Podiatry Will Fu at [facility] on 3/31 at 8:45 am   Rest of the management as per the primary team. Thank  you for the consult. Please page with pertinent questions or concerns.  ______________________________________________________________________ Subjective patient seen and examined at the bedside. Eager to go home No other complaints   Vitals BP (!) 142/97 (BP Location: Left Arm)    Pulse 73    Temp 98 F (36.7 C)    Resp 18    Ht 6' 2.02" (1.88 m)    Wt 129.3 kg    SpO2 100%    BMI 36.58 kg/m     Physical Exam Constitutional:  sitting up in the bed, comfortable     Comments:   Cardiovascular:     Rate and Rhythm: Normal rate and regular rhythm.     Heart sounds:  Pulmonary:     Effort: Pulmonary effort is normal.     Comments:   Abdominal:     Palpations: Abdomen is soft.     Tenderness: non distended   Musculoskeletal:        General: Left foot is bandaged  Skin:    Comments:   Neurological:     General: Grossly non-focal, awake, alert and oriented *3  Psychiatric:        Mood and Affect: Mood normal.   Pertinent Microbiology Results for orders placed or performed during the hospital encounter of 06/17/21  Resp Panel by RT-PCR (Flu A&B, Covid) Nasopharyngeal Swab     Status: None   Collection Time: 06/17/21 11:36 PM   Specimen: Nasopharyngeal  Swab; Nasopharyngeal(NP) swabs in vial transport medium  Result Value Ref Range Status   SARS Coronavirus 2 by RT PCR NEGATIVE NEGATIVE Final    Comment: (NOTE) SARS-CoV-2 target nucleic acids are NOT DETECTED.  The SARS-CoV-2 RNA is generally detectable in upper respiratory specimens during the acute phase of infection. The lowest concentration of SARS-CoV-2 viral copies this assay can detect is 138 copies/mL. A negative result does not preclude SARS-Cov-2 infection and should not be used as the sole basis for treatment or other patient management decisions. A negative result may occur with  improper specimen collection/handling, submission of specimen other than nasopharyngeal swab, presence of viral mutation(s)  within the areas targeted by this assay, and inadequate number of viral copies(<138 copies/mL). A negative result must be combined with clinical observations, patient history, and epidemiological information. The expected result is Negative.  Fact Sheet for Patients:  BloggerCourse.com  Fact Sheet for Healthcare Providers:  SeriousBroker.it  This test is no t yet approved or cleared by the [place] FDA and  has been authorized for detection and/or diagnosis of SARS-CoV-2 by FDA under an Emergency Use Authorization (EUA). This EUA will remain  in effect (meaning this test can be used) for the duration of the COVID-19 declaration under Section 564(b)(1) of the Act, 21 U.S.C.section 360bbb-3(b)(1), unless the authorization is terminated  or revoked sooner.       Influenza A by PCR NEGATIVE NEGATIVE Final   Influenza B by PCR NEGATIVE NEGATIVE Final    Comment: (NOTE) The Xpert Xpress SARS-CoV-2/FLU/RSV plus assay is intended as an aid in the diagnosis of influenza from Nasopharyngeal swab specimens and should not be used as a sole basis for treatment. Nasal washings and aspirates are unacceptable for Xpert Xpress SARS-CoV-2/FLU/RSV testing.  Fact Sheet for Patients: BloggerCourse.com  Fact Sheet for Healthcare Providers: SeriousBroker.it  This test is not yet approved or cleared by the [place] FDA and has been authorized for detection and/or diagnosis of SARS-CoV-2 by FDA under an Emergency Use Authorization (EUA). This EUA will remain in effect (meaning this test can be used) for the duration of the COVID-19 declaration under Section 564(b)(1) of the Act, 21 U.S.C. section 360bbb-3(b)(1), unless the authorization is terminated or revoked.  Performed at [facility], [address] [place], [place] [address]   Surgical pcr screen     Status: Abnormal    Collection Time: 06/18/21  7:48 PM   Specimen: Nasal Mucosa; Nasal Swab  Result Value Ref Range Status   MRSA, PCR NEGATIVE NEGATIVE Final   Staphylococcus aureus POSITIVE (A) NEGATIVE Final    Comment: (NOTE) The Xpert SA Assay (FDA approved for NASAL specimens in patients 36 years of age and older), is one component of a comprehensive surveillance program. It is not intended to diagnose infection nor to guide or monitor treatment. Performed at [facility], [address], [place], [place] [address]   Aerobic/Anaerobic Culture w Gram Stain (surgical/deep wound)     Status: None (Preliminary result)   Collection Time: 06/20/21  8:01 AM   Specimen: PATH Other; Body Fluid  Result Value Ref Range Status   Specimen Description ABSCESS  Final   Special Requests LEFT FOOT SPEC A  Final   Gram Stain   Final    FEW WBC PRESENT, PREDOMINANTLY MONONUCLEAR NO ORGANISMS SEEN    Culture   Final    NO GROWTH 1 DAY Performed at [facility], [address] [place], [place]  [address]    Report Status PENDING  Incomplete  Aerobic/Anaerobic Culture w Gram Stain (surgical/deep wound)     Status: None (Preliminary result)   Collection Time: 06/20/21  8:02 AM   Specimen: PATH Bone biopsy; Tissue  Result Value Ref Range Status   Specimen Description BONE  Final   Special Requests LEFT FOOT CLEAN MARGIN SPEC 2  Final   Gram Stain   Final    RARE WBC PRESENT, PREDOMINANTLY MONONUCLEAR NO ORGANISMS SEEN    Culture   Final    NO GROWTH 1 DAY Performed at [facility], [address], [place], [place] [address]    Report Status PENDING  Incomplete    Pertinent Lab. CBC Latest Ref Rng & Units 06/21/2021 06/19/2021 06/18/2021  WBC 4.0 - 10.5 K/uL 5.7 4.8 6.5  Hemoglobin 13.0 - 17.0 g/dL 3.8(G) 10.4(L) 10.2(L)  Hematocrit 39.0 - 52.0 % 29.7(L) 31.2(L) 31.1(L)  Platelets 150 - 400 K/uL 342 367 386   CMP Latest Ref Rng & Units 06/21/2021 06/19/2021 06/18/2021  Glucose 70 - 99 mg/dL  536(I) 680(H) 212(Y)  BUN 6 - 20 mg/dL 15 13 20   Creatinine 0.61 - 1.24 mg/dL 4.82 5.00  Sodium 135 - 145 mmol/L 138 137 138  Potassium 3.5 - 5.1 mmol/L 3.9 3.7 3.7  Chloride 98 - 111 mmol/L 106 103 101  CO2 22 - 32 mmol/L 25 24 26   Calcium 8.9 - 10.3 mg/dL 3.70) ) 9.0  Total Protein 6.5 - 8.1 g/dL - 6.9 -  Total Bilirubin 0.3 - 1.2 mg/dL - 0.3 -  Alkaline Phos 38 - 126 U/L - 53 -  AST 15 - 41 U/L - 18 -  ALT 0 - 44 U/L - 22 -     Pertinent Imaging today Plain films and CT images have been personally visualized and interpreted; radiology reports have been reviewed. Decision making incorporated into the Impression / Recommendations.  I spent more than 35 minutes for this patient encounter including review of prior medical records, coordination of care with primary/other specialist with greater than 50% of time being face to face/counseling and discussing diagnostics/treatment plan with the patient/family.  Electronically signed by:   4.8(G, MD Infectious Disease Physician [facility] for Infectious Disease Pager: [phone]

## 2021-06-22 NOTE — Progress Notes (Signed)
Physical Therapy Treatment ?Patient Details ?Name: Ernest Edwards ?MRN: [id number] ?DOB: 1960/12/27 ?Today's Date: 06/22/2021 ? ? ?History of Present Illness The pt is a 61 yo male presenting 3/2 with L foot wound, now s/p partial L first ray amputation on 3/5. PMH includes: DM II, HTN, and dyslipidemia. ? ?  ?PT Comments  ? ? Pt initially declines stair training session, however with encouragement is willing to demonstrate his technique to insure it maintains his NWB status. Once in stairwell pt educated on ascent/descent with use of R hand rail. Pt requires modA to accomplish and is visibly fatigued after 2 steps up and back. Pt relents to scooting up/down steps on his bottom and agrees it is much easier and safer. Confirmed with Podiatrist that pt is able to rest back of heel on ground for steadying with stair navigation. Also, educated pt on need for protection of surgical site for proper wound healing and to protect against further amputation. Pt also agreeable to RW for home use, CM notified.  ?   ?Recommendations for follow up therapy are one component of a multi-disciplinary discharge planning process, led by the attending physician.  Recommendations may be updated based on patient status, additional functional criteria and insurance authorization. ? ?Follow Up Recommendations ? No PT follow up ?  ?  ?Assistance Recommended at Discharge Intermittent Supervision/Assistance  ?Patient can return home with the following Help with stairs or ramp for entrance;Assistance with cooking/housework ?  ?Equipment Recommendations ? Rolling walker (2 wheels)  ?  ?   ?Precautions / Restrictions Precautions ?Precautions: Fall ?Restrictions ?Weight Bearing Restrictions: Yes ?LLE Weight Bearing: Non weight bearing ?Other Position/Activity Restrictions: Nonweightbearing to the left lower extremity with CAM walker boot, verified with Podiatrist that pt can rest back of heel of foot on ground for balance not weightbearing with  stairs  ?  ? ?Mobility ? Bed Mobility ?Overal bed mobility: Independent ?  ?  ?  ?  ?  ?  ?  ?  ? ?Transfers ?Overall transfer level: Needs assistance ?Equipment used: Rolling walker (2 wheels) ?Transfers: Sit to/from Stand ?Sit to Stand: Supervision ?  ?  ?  ?  ?  ?General transfer comment: cues for technique, pt then standing without assist ?  ? ?Ambulation/Gait ?Ambulation/Gait assistance: Min guard ?Gait Distance (Feet): 10 Feet ?Assistive device: Rolling walker (2 wheels) ?  ?Gait velocity: decreased ?Gait velocity interpretation: <1.31 ft/sec, indicative of household ambulator ?  ?General Gait Details: hop-to pattern with BUE support on RW, maintains NWB well. fatigues quickly ? ? ?Stairs ?Stairs: Yes ?Stairs assistance: Mod assist, Supervision ?Stair Management: One rail Right, Sideways, Step to pattern, Seated/boosting ?Number of Stairs: 10 ?General stair comments: Demonstrated scooting up and down steps on bottom as safest way to navigate stairs, pt sure that he can ascend decend steps with NWB and use of rail. PT demonstrated technique however pt requiring modA for steadying and increased energy expenditure for hopping up steps. Pt ultimately trials scooting techinque and reports easier to accomplish than he thought ? ? ?  ? ? ?  ?Balance Overall balance assessment: Mild deficits observed, not formally tested ?  ?  ?  ?  ?  ?  ?  ?  ?  ?  ?  ?  ?  ?  ?  ?  ?  ?  ?  ? ?  ?Cognition Arousal/Alertness: Awake/alert ?Behavior During Therapy: [facility] for tasks assessed/performed ?Overall Cognitive Status: Within Functional Limits for tasks assessed ?  ?  ?  ?  ?  ?  ?  ?  ?  ?  ?  ?  ?  ?  ?  ?  ?  ?  ?  ? ?  ?   ?  General Comments General comments (skin integrity, edema, etc.): VSS on RA, dressing clean and dry, able to don CAM boot without assist ?  ?  ? ?Pertinent Vitals/Pain Pain Assessment ?Pain Assessment: No/denies pain  ? ? ? ?PT Goals (current goals can now be found in the care plan section) Acute Rehab PT  Goals ?Patient Stated Goal: return home ?PT Goal Formulation: With patient ?Time For Goal Achievement: 07/04/21 ?Potential to Achieve Goals: Good ?Progress towards PT goals: Progressing toward goals ? ?  ?Frequency ? ? ? Min 3X/week ? ? ? ?  ?PT Plan Equipment recommendations need to be updated  ? ? ?   ?AM-PAC PT "6 Clicks" Mobility   ?Outcome Measure ? Help needed turning from your back to your side while in a flat bed without using bedrails?: None ?Help needed moving from lying on your back to sitting on the side of a flat bed without using bedrails?: None ?Help needed moving to and from a bed to a chair (including a wheelchair)?: None ?Help needed standing up from a chair using your arms (e.g., wheelchair or bedside chair)?: None ?Help needed to walk in hospital room?: A Little ?Help needed climbing 3-5 steps with a railing? : A Little ?6 Click Score: 22 ? ?  ?End of Session Equipment Utilized During Treatment: Gait belt ?Activity Tolerance: Patient tolerated treatment well ?Patient left: with call bell/phone within reach;in chair ?Nurse Communication: Mobility status ?PT Visit Diagnosis: Unsteadiness on feet (R26.81);Other abnormalities of gait and mobility (R26.89);Muscle weakness (generalized) (M62.81) ?  ? ? ?Time: 4193-7902 ?PT Time Calculation (min) (ACUTE ONLY): 40 min ? ?Charges:  $Gait Training: 23-37 mins ?$Therapeutic Activity: 8-22 mins          ?          ? ?Billye Pickerel B. Beverely Risen PT, DPT ?Acute Rehabilitation Services ?Pager [phone] ?Office [phone] ? ? ? ?Elon Alas Fleet ?06/22/2021, 11:19 AM ? ?

## 2021-06-22 NOTE — TOC CM/SW Note (Signed)
Walker ordered for home with Velna Hatchet with [employer]. ?

## 2021-06-22 NOTE — TOC Progression Note (Addendum)
Transition of Care (TOC) - Progression Note  ? ? ?Patient Details  ?Name: Keyan Wean Matson ?MRN: [id number] ?Date of Birth: 09-Jan-1961 ? ?Transition of Care (TOC) CM/SW Contact  ?Marilu Favre, RN ?Phone Number: ?06/22/2021, 12:36 PM ? ?Clinical Narrative:    ? ? ?PT recommended walker for home, patient in agreement . NCM ordered through [facility] with [place].  ? ?Patient received a walker in 2021, insurance only covers walker every five years. If patient wants a walker it would be out of pocket , cost $41.00.  ? ?NCM explained to patient. Patient asking if [employer] can bring the walker to him at the hospital and sent him a bill. NCM left message for Freda Munro at [facility]. Awaiting response  ?  ? Freda Munro with [employer] responded , he will need to pay prior to delivery . NCM passed message on to patient. Patient would like [employer] to call him directly at [phone] . Message given to Freda Munro  ? ? ?Freda Munro with [employer] spoke to patient and he declined walker  ? ?Transition of Care (TOC) Screening Note ? ? ?Patient Details  ?Name: Bertice Helmich Skow ?Date of Birth: 04/20/1960 ? ? ? ? ?Transition of Care Department [facility]) has reviewed patient and no TOC needs have been identified at this time. We will continue to monitor patient advancement through interdisciplinary progression rounds. If new patient transition needs arise, please place a TOC consult. ?  ?Expected Discharge Plan and Services ?  ?  ?  ?  ?  ?Expected Discharge Date: 06/22/21               ?  ?  ?  ?  ?  ?  ?  ?  ?  ?  ? ? ?Social Determinants of Health (SDOH) Interventions ?  ? ?Readmission Risk Interventions ?No flowsheet data found. ? ?

## 2021-06-25 LAB — AEROBIC/ANAEROBIC CULTURE W GRAM STAIN (SURGICAL/DEEP WOUND): Culture: NO GROWTH

## 2021-06-25 NOTE — Progress Notes (Signed)
?  Microbiology  ? 5 d ago  ?Specimen Description BONE   ?Special Requests LEFT FOOT CLEAN MARGIN SPEC 2   ?Gram Stain RARE WBC PRESENT, PREDOMINANTLY MONONUCLEAR  ?NO ORGANISMS SEEN   ?Culture No growth aerobically or anaerobically.  ?Performed at [facility], [address] [place], [place] [address]   ?Report Status 06/25/2021 FINAL   ? ? ? ? ?Pathology  ?FINAL MICROSCOPIC DIAGNOSIS:  ? ?A. FOOT, LEFT FIRST RAY, AMPUTATION:  ?- Skin with ulceration, associated necrotizing inflammation and  ?osteomyelitis  ? ?B. BONE, LEFT FOOT CLEAN MARGIN:  ?- Benign bone and soft tissue with reactive changes  ? ?Odette Fraction, MD ?Infectious Disease Physician ?[facility] for Infectious Disease ?[address] ?[place], [place] [address] ?Phone: [phone]  Fax: [phone] ?' ?

## 2021-06-28 ENCOUNTER — Telehealth: Payer: Self-pay | Admitting: *Deleted

## 2021-06-28 NOTE — Telephone Encounter (Signed)
Called and gave message to patient, verbalized understanding and said that it has not bleed since that one day over the weekend.

## 2021-06-28 NOTE — Telephone Encounter (Signed)
Patient is calling because over the weekend after getting up to go to bathroom, his surgery foot bleed a little for a few min. He placed a paper towel and a plastic bag over it  and has not noticed any more bleeding since,no fever ,pain and foot does not feel hot. He wanted to let the physician know. Please advise. ?

## 2021-06-30 ENCOUNTER — Other Ambulatory Visit: Payer: Self-pay

## 2021-06-30 ENCOUNTER — Ambulatory Visit (INDEPENDENT_AMBULATORY_CARE_PROVIDER_SITE_OTHER): Payer: BC Managed Care – PPO | Admitting: Podiatry

## 2021-06-30 ENCOUNTER — Ambulatory Visit (INDEPENDENT_AMBULATORY_CARE_PROVIDER_SITE_OTHER): Payer: BC Managed Care – PPO

## 2021-06-30 DIAGNOSIS — E11621 Type 2 diabetes mellitus with foot ulcer: Secondary | ICD-10-CM

## 2021-06-30 DIAGNOSIS — M86472 Chronic osteomyelitis with draining sinus, left ankle and foot: Secondary | ICD-10-CM

## 2021-06-30 DIAGNOSIS — L97424 Non-pressure chronic ulcer of left heel and midfoot with necrosis of bone: Secondary | ICD-10-CM

## 2021-06-30 DIAGNOSIS — Z9889 Other specified postprocedural states: Secondary | ICD-10-CM

## 2021-07-06 NOTE — Progress Notes (Signed)
?  Subjective:  ?Patient ID: Ernest Edwards, male    DOB: Oct 03, 1960,  MRN: [id number] ? ?Chief Complaint  ?Patient presents with  ? Routine Post Op  ?   POV #1 DOS 06/20/2021 Partial First Ray Amputation, left foot  ? ? ?DOS: 06/20/2021 ?Procedure: Left partial first ray amputation primarily closed ? ?61 y.o. male returns for post-op check.  Patient states that he is doing well.  He has been nonweightbearing to this left lower extremity taking it easy.  He states that his bandages clean dry and intact.  He is a diabetic with last A1c of that is unknown ? ?Review of Systems: Negative except as noted in the HPI. Denies N/V/F/Ch. ? ?Past Medical History:  ?Diagnosis Date  ? Diabetes mellitus without complication (HCC)   ? Hypertension   ? ? ?Current Outpatient Medications:  ?  amLODipine (NORVASC) 10 MG tablet, Take 10 mg by mouth daily., Disp: , Rfl:  ?  atorvastatin (LIPITOR) 80 MG tablet, Take 80 mg by mouth daily., Disp: , Rfl:  ?  chlorthalidone (HYGROTON) 25 MG tablet, Take 1 tablet (25 mg total) by mouth daily., Disp: 90 tablet, Rfl: 0 ?  insulin glargine (LANTUS) 100 UNIT/ML Solostar Pen, Inject 40 Units into the skin at bedtime., Disp: 36 mL, Rfl: 0 ?  metFORMIN (GLUCOPHAGE-XR) 500 MG 24 hr tablet, Take 4 tablets (2,000 mg total) by mouth daily with breakfast., Disp: 120 tablet, Rfl: 2 ?  mupirocin ointment (BACTROBAN) 2 %, Apply 1 application topically daily. (Patient not taking: Reported on 06/17/2021), Disp: 30 g, Rfl: 2 ?  OVER THE COUNTER MEDICATION, Take 1 capsule by mouth daily. CLA- body fat reduction, Disp: , Rfl:  ?  oxyCODONE (ROXICODONE) 5 MG immediate release tablet, Take 1 tablet (5 mg total) by mouth every 8 (eight) hours as needed., Disp: 15 tablet, Rfl: 0 ?  sildenafil (VIAGRA) 100 MG tablet, Take 100 mg by mouth daily as needed for erectile dysfunction., Disp: , Rfl:  ?  valsartan (DIOVAN) 160 MG tablet, Take 320 mg by mouth daily., Disp: , Rfl:  ? ?Social History  ? ?Tobacco Use  ?Smoking Status  Never  ?Smokeless Tobacco Never  ? ? ?No Known Allergies ?Objective:  ?There were no vitals filed for this visit. ?There is no height or weight on file to calculate BMI. ?Constitutional Well developed. ?Well nourished.  ?Vascular Foot warm and well perfused. ?Capillary refill normal to all digits.   ?Neurologic Normal speech. ?Oriented to person, place, and time. ?Epicritic sensation to light touch grossly present bilaterally.  ?Dermatologic Skin healing well without signs of infection. Skin edges well coapted without signs of infection.  ?Orthopedic: Tenderness to palpation noted about the surgical site.  ? ?Radiographs: 3 views of skeletally mature adult left foot: 3 views of skeletally mature adult left foot: Partial first ray amputation noted.  History of partial fifth ray amputation ?Assessment:  ? ?1. Diabetic ulcer of left midfoot associated with type 2 diabetes mellitus, with necrosis of bone (HCC)   ?2. 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 Status post foot surgery   ? ?Plan:  ?Patient was evaluated and treated and all questions answered. ? ?S/p foot surgery left ?-Progressing as expected post-operatively. ?-XR: See above ?-WB Status: Nonweightbearing in left lower extremity ?-Sutures: Intact.  No clinical signs of dehiscence noted.  No complication noted. ?-Medications: None ?-Foot redressed. ? ?No follow-ups on file.  ?

## 2021-07-07 DIAGNOSIS — M79676 Pain in unspecified toe(s): Secondary | ICD-10-CM

## 2021-07-08 ENCOUNTER — Other Ambulatory Visit: Payer: Self-pay

## 2021-07-08 ENCOUNTER — Ambulatory Visit: Payer: BC Managed Care – PPO | Admitting: Student

## 2021-07-08 ENCOUNTER — Encounter (HOSPITAL_COMMUNITY): Payer: Self-pay | Admitting: Emergency Medicine

## 2021-07-08 ENCOUNTER — Ambulatory Visit (HOSPITAL_COMMUNITY)
Admission: EM | Admit: 2021-07-08 | Discharge: 2021-07-08 | Disposition: A | Discharge status: Home / Self Care | Payer: BC Managed Care – PPO | Attending: Emergency Medicine | Admitting: Emergency Medicine

## 2021-07-08 DIAGNOSIS — Z794 Long term (current) use of insulin: Secondary | ICD-10-CM | POA: Diagnosis not present

## 2021-07-08 DIAGNOSIS — Z760 Encounter for issue of repeat prescription: Secondary | ICD-10-CM | POA: Diagnosis not present

## 2021-07-08 DIAGNOSIS — E1100 Type 2 diabetes mellitus with hyperosmolarity without nonketotic hyperglycemic-hyperosmolar coma (NKHHC): Secondary | ICD-10-CM

## 2021-07-08 LAB — CBG MONITORING, ED: Glucose-Capillary: 230 mg/dL — ABNORMAL HIGH (ref 70–99)

## 2021-07-08 MED ORDER — METFORMIN HCL ER 500 MG PO TB24: 2000.0000 mg | ORAL_TABLET | Freq: Every day | ORAL | 2 refills | Status: DC

## 2021-07-08 NOTE — ED Provider Notes (Signed)
?[facility] ? ? ? ?CSN: [id number] ?Arrival date & time: 07/08/21  1133 ? ? ?  ? ?History   ?Chief Complaint ?Chief Complaint  ?Patient presents with  ? Medication Refill  ? ? ?HPI ?Ernest Edwards is a 61 y.o. male.  ? ?Patient presents requesting refill on metformin.  Endorses that he was released from his primary doctor's care for missing appointments .  Last dose of medication 3 weeks ago.  Endorses that he checks his blood pressures daily as he uses insulin, typically ranging 150s to 200s which he endorses is elevated since being off medication.  Endorses that he follows a diabetic diet.  History of  hypertension. ? ?Past Medical History:  ?Diagnosis Date  ? Diabetes mellitus without complication (HCC)   ? Hypertension   ? ? ?Patient Active Problem List  ? Diagnosis Date Noted  ? Diabetic foot ulcer (HCC) 06/18/2021  ? Toe osteomyelitis, left (HCC) 06/18/2021  ? Uncontrolled type 2 diabetes mellitus with hyperglycemia, with long-term current use of insulin (HCC) 06/18/2021  ? Essential hypertension 06/18/2021  ? AKI (acute kidney injury) (HCC) 06/18/2021  ? Dyslipidemia 06/18/2021  ? Obesity, Class II, BMI 35-39.9 06/18/2021  ? Ulcer of foot (HCC) 08/26/2020  ? Persistent proteinuria associated with type 2 diabetes mellitus (HCC) 08/23/2019  ? ED (erectile dysfunction) 08/21/2019  ? Deformity of left foot 08/16/2019  ? History of amputation of lesser toe, left (HCC) 08/16/2019  ? Type 2 diabetes mellitus with circulatory disorder, with long-term current use of insulin (HCC) 08/18/2016  ? Back pain 03/12/2014  ? Family history of premature CAD 03/12/2014  ? Hypertension associated with diabetes (HCC) 03/12/2014  ? ? ?Past Surgical History:  ?Procedure Laterality Date  ? AMPUTATION Left 06/20/2021  ? Procedure: Partial First Ray Amputation, left foot;  Surgeon: Candelaria StagersPatel, Kevin P, DPM;  Location: [facility];  Service: Podiatry;  Laterality: Left;  ? ? ? ? ? ?Home Medications   ? ?Prior to Admission medications    ?Medication Sig Start Date End Date Taking? Authorizing Provider  ?amLODipine (NORVASC) 10 MG tablet Take 10 mg by mouth daily. 06/13/21   [provider]  ?atorvastatin (LIPITOR) 80 MG tablet Take 80 mg by mouth daily. 05/07/21   [provider]  ?chlorthalidone (HYGROTON) 25 MG tablet Take 1 tablet (25 mg total) by mouth daily. 11/16/20   Wieters, Hallie C, PA-C  ?insulin glargine (LANTUS) 100 UNIT/ML Solostar Pen Inject 40 Units into the skin at bedtime. 11/17/20 06/17/21  Wieters, Hallie C, PA-C  ?metFORMIN (GLUCOPHAGE-XR) 500 MG 24 hr tablet Take 4 tablets (2,000 mg total) by mouth daily with breakfast. 07/08/21 10/06/21  Valinda HoarWhite, Skarlet Lyons R, NP  ?mupirocin ointment (BACTROBAN) 2 % Apply 1 application topically daily. ?Patient not taking: Reported on 06/17/2021 03/04/21   Edwin CapMcDonald, Adam R, DPM  ?OVER THE COUNTER MEDICATION Take 1 capsule by mouth daily. CLA- body fat reduction    [provider]  ?oxyCODONE (ROXICODONE) 5 MG immediate release tablet Take 1 tablet (5 mg total) by mouth every 8 (eight) hours as needed. 06/22/21 06/22/22  Leatha GildingGherghe, Costin M, MD  ?sildenafil (VIAGRA) 100 MG tablet Take 100 mg by mouth daily as needed for erectile dysfunction. 04/06/21   [provider]  ?valsartan (DIOVAN) 160 MG tablet Take 320 mg by mouth daily. 06/07/21   [provider]  ?hydrochlorothiazide (MICROZIDE) 12.5 MG capsule Take 1 capsule (12.5 mg total) by mouth daily. 02/21/14 05/20/19  Ernest GladLaisure, Heather, PA-C  ?lisinopril (ZESTRIL) 10  MG tablet Take 1 tablet (10 mg total) by mouth daily. 05/20/19 05/20/19  [name], NP  ? ? ?Family History ?Family History  ?Problem Relation Age of Onset  ? Hypertension Mother   ? Diabetes Mother   ? Diabetes Father   ? Hypertension Father   ? ? ?Social History ?Social History  ? ?Tobacco Use  ? Smoking status: Never  ? Smokeless tobacco: Never  ?Vaping Use  ? Vaping Use: Never used  ?Substance Use Topics  ? Alcohol use: No  ? Drug use: No  ? ? ? ?Allergies    ?Patient has no known allergies. ? ? ?Review of Systems ?Review of Systems  ?Constitutional: Negative.   ?Respiratory: Negative.    ?Cardiovascular: Negative.   ?Genitourinary: Negative.   ?Musculoskeletal: Negative.   ?Skin: Negative.   ?Neurological: Negative.   ? ? ?Physical Exam ?Triage Vital Signs ?ED Triage Vitals  ?Enc Vitals Group  ?   BP 07/08/21 1249 (!) 127/94  ?   Pulse Rate 07/08/21 1249 100  ?   Resp 07/08/21 1249 19  ?   Temp 07/08/21 1249 98 ?F (36.7 ?C)  ?   Temp Source 07/08/21 1249 Oral  ?   SpO2 07/08/21 1249 98 %  ?   Weight --   ?   Height --   ?   Head Circumference --   ?   Peak Flow --   ?   Pain Score 07/08/21 1248 0  ?   Pain Loc --   ?   Pain Edu? --   ?   Excl. in GC? --   ? ?No data found. ? ?Updated Vital Signs ?BP (!) 127/94 (BP Location: Right Arm)   Pulse 100   Temp 98 ?F (36.7 ?C) (Oral)   Resp 19   SpO2 98%  ? ?Visual Acuity ?Right Eye Distance:   ?Left Eye Distance:   ?Bilateral Distance:   ? ?Right Eye Near:   ?Left Eye Near:    ?Bilateral Near:    ? ?Physical Exam ?Constitutional:   ?   Appearance: Normal appearance.  ?HENT:  ?   Head: Normocephalic.  ?Eyes:  ?   Extraocular Movements: Extraocular movements intact.  ?Pulmonary:  ?   Effort: Pulmonary effort is normal.  ?Skin: ?   General: Skin is warm and dry.  ?Neurological:  ?   Mental Status: He is alert and oriented to person, place, and time. Mental status is at baseline.  ?Psychiatric:     ?   Mood and Affect: Mood normal.     ?   Behavior: Behavior normal.  ? ? ? ?UC Treatments / Results  ?Labs ?(all labs ordered are listed, but only abnormal results are displayed) ?Labs Reviewed  ?CBG MONITORING, ED  ? ? ?EKG ? ? ?Radiology ?No results found. ? ?Procedures ?Procedures (including critical care time) ? ?Medications Ordered in UC ?Medications - No data to display ? ?Initial Impression / Assessment and Plan / UC Course  ?I have reviewed the triage vital signs and the nursing notes. ? ?Pertinent labs & imaging results  that were available during my care of the patient were reviewed by me and considered in my medical decision making (see chart for details). ? ?Type 2 diabetes mellitus with hyperosmolarity without coma with long-term current use of insulin ?Encounter for medication refill ? ?Metformin refilled for 3 months, point-of-care CBG 230,, no signs of DKA, primary care referral placed in system, patient may follow-up with  urgent care as needed ?Final Clinical Impressions(s) / UC Diagnoses  ? ?Final diagnoses:  ?None  ? ?Discharge Instructions   ?None ?  ? ?ED Prescriptions   ? ? Medication Sig Dispense Auth. Provider  ? metFORMIN (GLUCOPHAGE-XR) 500 MG 24 hr tablet Take 4 tablets (2,000 mg total) by mouth daily with breakfast. 120 tablet Zyon Rosser, Elita Boone, NP  ? ?  ? ?PDMP not reviewed this encounter. ?  ?Valinda Hoar, NP ?07/08/21 1324 ? ?

## 2021-07-08 NOTE — ED Triage Notes (Signed)
Pt reports that he has been out of Metformin for 3 weeks. PCP dropped him as patient. Pt wanting glucose checked as been up and down at home.  ?

## 2021-07-08 NOTE — Discharge Instructions (Addendum)
CBG in office- 230  ? ?Continue to take medication as prescribed, it has been refilled for 3 months ? ?Please continue to monitor your blood sugar as directed ? ?A primary care referral has been placed to help you find a new doctor  ?

## 2021-07-14 ENCOUNTER — Ambulatory Visit (INDEPENDENT_AMBULATORY_CARE_PROVIDER_SITE_OTHER): Payer: BC Managed Care – PPO | Admitting: Podiatry

## 2021-07-14 DIAGNOSIS — L97424 Non-pressure chronic ulcer of left heel and midfoot with necrosis of bone: Secondary | ICD-10-CM

## 2021-07-14 DIAGNOSIS — E11621 Type 2 diabetes mellitus with foot ulcer: Secondary | ICD-10-CM

## 2021-07-14 DIAGNOSIS — Z9889 Other specified postprocedural states: Secondary | ICD-10-CM

## 2021-07-14 NOTE — Progress Notes (Signed)
?  Subjective:  ?Patient ID: Ernest Edwards, male    DOB: [birth year],  MRN: [id number] ? ?Chief Complaint  ?Patient presents with  ? Routine Post Op  ?  POV #2 DOSPartial First Ray Amputation  ? ? ?DOS: 06/20/2021 ?Procedure: Left partial first ray amputation primarily closed ? ?61 y.o. male returns for post-op check.  Patient states that he is doing well.  He has been nonweightbearing to this left lower extremity taking it easy.  He states that his bandages clean dry and intact.  He is a diabetic with last A1c of that is unknown ? ?Review of Systems: Negative except as noted in the HPI. Denies N/V/F/Ch. ? ?Past Medical History:  ?Diagnosis Date  ? Diabetes mellitus without complication (HCC)   ? Hypertension   ? ? ?Current Outpatient Medications:  ?  amLODipine (NORVASC) 10 MG tablet, Take 10 mg by mouth daily., Disp: , Rfl:  ?  atorvastatin (LIPITOR) 80 MG tablet, Take 80 mg by mouth daily., Disp: , Rfl:  ?  chlorthalidone (HYGROTON) 25 MG tablet, Take 1 tablet (25 mg total) by mouth daily., Disp: 90 tablet, Rfl: 0 ?  insulin glargine (LANTUS) 100 UNIT/ML Solostar Pen, Inject 40 Units into the skin at bedtime., Disp: 36 mL, Rfl: 0 ?  metFORMIN (GLUCOPHAGE-XR) 500 MG 24 hr tablet, Take 4 tablets (2,000 mg total) by mouth daily with breakfast., Disp: 120 tablet, Rfl: 2 ?  mupirocin ointment (BACTROBAN) 2 %, Apply 1 application topically daily. (Patient not taking: Reported on 06/17/2021), Disp: 30 g, Rfl: 2 ?  OVER THE COUNTER MEDICATION, Take 1 capsule by mouth daily. CLA- body fat reduction, Disp: , Rfl:  ?  oxyCODONE (ROXICODONE) 5 MG immediate release tablet, Take 1 tablet (5 mg total) by mouth every 8 (eight) hours as needed., Disp: 15 tablet, Rfl: 0 ?  sildenafil (VIAGRA) 100 MG tablet, Take 100 mg by mouth daily as needed for erectile dysfunction., Disp: , Rfl:  ?  valsartan (DIOVAN) 160 MG tablet, Take 320 mg by mouth daily., Disp: , Rfl:  ? ?Social History  ? ?Tobacco Use  ?Smoking Status Never  ?Smokeless  Tobacco Never  ? ? ?No Known Allergies ?Objective:  ?There were no vitals filed for this visit. ?There is no height or weight on file to calculate BMI. ?Constitutional Well developed. ?Well nourished.  ?Vascular Foot warm and well perfused. ?Capillary refill normal to all digits.   ?Neurologic Normal speech. ?Oriented to person, place, and time. ?Epicritic sensation to light touch grossly present bilaterally.  ?Dermatologic Moses skin incision has reepithelialized.  Central dehiscence noted measuring 0.5 cm x 0.3 cm x 0.4 cm  ?Orthopedic: No tenderness to palpation noted about the surgical site.  ? ?Radiographs: 3 views of skeletally mature adult left foot: 3 views of skeletally mature adult left foot: Partial first ray amputation noted.  History of partial fifth ray amputation ?Assessment:  ? ?1. Diabetic ulcer of left midfoot associated with type 2 diabetes mellitus, with necrosis of bone (HCC)   ?2. Status post foot surgery   ? ? ?Plan:  ?Patient was evaluated and treated and all questions answered. ? ?S/p foot surgery left ?-Progressing as expected post-operatively. ?-XR: See above ?-WB Status: Nonweightbearing in left lower extremity ?-Sutures: Removed.  Central dehiscence noted the rest of the wound has completely reepithelialized.  I encouraged him to wet-to-dry dressing.  He states understanding.  d. ?-Medications: None ?-Foot redressed. ? ?No follow-ups on file.  ?

## 2021-07-15 NOTE — Progress Notes (Deleted)
? ?  ? ? ?Patient Active Problem List  ? Diagnosis Date Noted  ? Diabetic foot ulcer (HCC) 06/18/2021  ? Toe osteomyelitis, left (HCC) 06/18/2021  ? Uncontrolled type 2 diabetes mellitus with hyperglycemia, with long-term current use of insulin (HCC) 06/18/2021  ? Essential hypertension 06/18/2021  ? AKI (acute kidney injury) (HCC) 06/18/2021  ? Dyslipidemia 06/18/2021  ? Obesity, Class II, BMI 35-39.9 06/18/2021  ? Ulcer of foot (HCC) 08/26/2020  ? Persistent proteinuria associated with type 2 diabetes mellitus (HCC) 08/23/2019  ? ED (erectile dysfunction) 08/21/2019  ? Deformity of left foot 08/16/2019  ? History of amputation of lesser toe, left (HCC) 08/16/2019  ? Type 2 diabetes mellitus with circulatory disorder, with long-term current use of insulin (HCC) 08/18/2016  ? Back pain 03/12/2014  ? Family history of premature CAD 03/12/2014  ? Hypertension associated with diabetes (HCC) 03/12/2014  ? ? ?Patient's Medications  ?New Prescriptions  ? No medications on file  ?Previous Medications  ? AMLODIPINE (NORVASC) 10 MG TABLET    Take 10 mg by mouth daily.  ? ATORVASTATIN (LIPITOR) 80 MG TABLET    Take 80 mg by mouth daily.  ? CHLORTHALIDONE (HYGROTON) 25 MG TABLET    Take 1 tablet (25 mg total) by mouth daily.  ? INSULIN GLARGINE (LANTUS) 100 UNIT/ML SOLOSTAR PEN    Inject 40 Units into the skin at bedtime.  ? METFORMIN (GLUCOPHAGE-XR) 500 MG 24 HR TABLET    Take 4 tablets (2,000 mg total) by mouth daily with breakfast.  ? MUPIROCIN OINTMENT (BACTROBAN) 2 %    Apply 1 application topically daily.  ? OVER THE COUNTER MEDICATION    Take 1 capsule by mouth daily. CLA- body fat reduction  ? OXYCODONE (ROXICODONE) 5 MG IMMEDIATE RELEASE TABLET    Take 1 tablet (5 mg total) by mouth every 8 (eight) hours as needed.  ? SILDENAFIL (VIAGRA) 100 MG TABLET    Take 100 mg by mouth daily as needed for erectile dysfunction.  ? VALSARTAN (DIOVAN) 160 MG TABLET    Take 320 mg by mouth daily.  ?Modified Medications  ? No  medications on file  ?Discontinued Medications  ? No medications on file  ? ? ?Subjective: ?***  ? ?Review of Systems: ?ROS ? ?Past Medical History:  ?Diagnosis Date  ? Diabetes mellitus without complication (HCC)   ? Hypertension   ? ? ?Social History  ? ?Tobacco Use  ? Smoking status: Never  ? Smokeless tobacco: Never  ?Vaping Use  ? Vaping Use: Never used  ?Substance Use Topics  ? Alcohol use: No  ? Drug use: No  ? ? ?Family History  ?Problem Relation Age of Onset  ? Hypertension Mother   ? Diabetes Mother   ? Diabetes Father   ? Hypertension Father   ? ? ?No Known Allergies ? ?Health Maintenance  ?Topic Date Due  ? HEMOGLOBIN A1C  Never done  ? COVID-19 Vaccine (1) Never done  ? FOOT EXAM  Never done  ? OPHTHALMOLOGY EXAM  Never done  ? Hepatitis C Screening  Never done  ? TETANUS/TDAP  Never done  ? COLONOSCOPY (Pts 45-59yrs Insurance coverage will need to be confirmed)  Never done  ? Zoster Vaccines- Shingrix (1 of 2) Never done  ? INFLUENZA VACCINE  Never done  ? HIV Screening  Completed  ? HPV VACCINES  Aged Out  ? ? ?Objective: ? ?There were no vitals filed for this visit. ?There is no height or weight  on file to calculate BMI. ? ?Physical Exam ?Constitutional:   ?   Appearance: Normal appearance.  ?HENT:  ?   Head: Normocephalic and atraumatic.   ?   Mouth: Mucous membranes are moist.  ?Eyes: ?   Conjunctiva/sclera: Conjunctivae normal.  ?   Pupils: Pupils are equal, round, and reactive to light.  ? ?Cardiovascular:  ?   Rate and Rhythm: Normal rate and regular rhythm.  ?   Heart sounds: No murmur heard. No friction rub. No gallop.  ? ?Pulmonary:  ?   Effort: Pulmonary effort is normal.  ?   Breath sounds: Normal breath sounds.  ? ?Abdominal:  ?   General: Non distended  ?   Palpations: soft.  ? ?Musculoskeletal:     ?   General: Normal range of motion.  ? ?Skin: ?   General: Skin is warm and dry.  ?   Comments: ? ?Neurological:  ?   General: grossly non focal  ?   Mental Status: awake, alert and oriented  to person, place, and time.  ? ?Psychiatric:     ?   Mood and Affect: Mood normal.  ? ?Lab Results ?Lab Results  ?Component Value Date  ? WBC 5.7 06/21/2021  ? HGB 9.7 (L) 06/21/2021  ? HCT 29.7 (L) 06/21/2021  ? MCV 80.5 06/21/2021  ? PLT 342 06/21/2021  ?  ?Lab Results  ?Component Value Date  ? CREATININE 1.19 06/21/2021  ? BUN 15 06/21/2021  ? NA 138 06/21/2021  ? K 3.9 06/21/2021  ? CL 106 06/21/2021  ? CO2 25 06/21/2021  ?  ?Lab Results  ?Component Value Date  ? ALT 22 06/19/2021  ? AST 18 06/19/2021  ? ALKPHOS 53 06/19/2021  ? BILITOT 0.3 06/19/2021  ?  ?No results found for: CHOL, HDL, LDLCALC, LDLDIRECT, TRIG, CHOLHDL ?No results found for: LABRPR, RPRTITER ?No results found for: HIV1RNAQUANT, HIV1RNAVL, CD4TABS ?  ?Problem List Items Addressed This Visit   ?None ? ? ?I have personally spent more than 70 minutes involved in face-to-face and non-face-to-face activities for this patient on the day of the visit. Professional time spent includes the following activities: Preparing to see the patient (review of tests), Obtaining and/or reviewing separately obtained history (admission/discharge record), Performing a medically appropriate examination and/or evaluation , Ordering medications/tests/procedures, referring and communicating with other health care professionals, Documenting clinical information in the EMR, Independently interpreting results (not separately reported), Communicating results to the patient/family/caregiver, Counseling and educating the patient/family/caregiver and Care coordination (not separately reported).  ? ?Wilber Oliphant, MD ?[facility] ?[facility] ?07/15/2021, 9:00 AM ? ?

## 2021-07-16 ENCOUNTER — Inpatient Hospital Stay: Payer: BC Managed Care – PPO | Admitting: Infectious Diseases

## 2021-08-04 ENCOUNTER — Ambulatory Visit (INDEPENDENT_AMBULATORY_CARE_PROVIDER_SITE_OTHER): Payer: BC Managed Care – PPO | Admitting: Podiatry

## 2021-08-04 ENCOUNTER — Ambulatory Visit (INDEPENDENT_AMBULATORY_CARE_PROVIDER_SITE_OTHER): Payer: BC Managed Care – PPO

## 2021-08-04 DIAGNOSIS — Q666 Other congenital valgus deformities of feet: Secondary | ICD-10-CM

## 2021-08-04 DIAGNOSIS — M86671 Other chronic osteomyelitis, right ankle and foot: Secondary | ICD-10-CM | POA: Diagnosis not present

## 2021-08-04 DIAGNOSIS — L97424 Non-pressure chronic ulcer of left heel and midfoot with necrosis of bone: Secondary | ICD-10-CM

## 2021-08-04 DIAGNOSIS — E11621 Type 2 diabetes mellitus with foot ulcer: Secondary | ICD-10-CM

## 2021-08-04 DIAGNOSIS — Z9889 Other specified postprocedural states: Secondary | ICD-10-CM

## 2021-08-04 DIAGNOSIS — L089 Local infection of the skin and subcutaneous tissue, unspecified: Secondary | ICD-10-CM

## 2021-08-04 DIAGNOSIS — M86672 Other chronic osteomyelitis, left ankle and foot: Secondary | ICD-10-CM | POA: Diagnosis not present

## 2021-08-04 NOTE — Progress Notes (Signed)
SITUATION ?Reason for Consult: Evaluation for Bilateral Custom Foot Orthoses ?Patient / Caregiver Report: Patient is ready for foot orthotics ? ?OBJECTIVE DATA: ?Patient History / Diagnosis:  ?  ICD-10-CM   ?1. Status post foot surgery  Z98.890   ?  ?2. Type 2 diabetes mellitus with left diabetic foot infection (HCC)  E11.628   ? L08.9   ?  ? ? ?Current or Previous Devices:   None and no history ? ?Foot Examination: ?Skin presentation:   Compromised ?Ulcers & Callousing:   Callusing on 1st met left, missing hallux right  ?Toe / Foot Deformities:  Hallux amputation ?Weight Bearing Presentation:  Rectus ?Sensation:    Compromised ? ?Shoe Size:    37M ? ?ORTHOTIC RECOMMENDATION ?Recommended Device: 1x pair of custom functional foot orthotics ? ?GOALS OF ORTHOSES ?- Reduce Pain ?- Prevent Foot Deformity ?- Prevent Progression of Further Foot Deformity ?- Relieve Pressure ?- Improve the Overall Biomechanical Function of the Foot and Lower Extremity. ? ?ACTIONS PERFORMED ?Potential out of pocket cost was communicated to patient. Patient understood and consent to casting. Patient was casted for Foot Orthoses via crush box. Procedure was explained and patient tolerated procedure well. Casts were shipped to central fabrication. All questions were answered and concerns addressed. ? ?PLAN ?Patient is to be called for fitting when devices are ready.  ? ? ?

## 2021-08-10 NOTE — Progress Notes (Signed)
?Subjective:  ?Patient ID: Ernest Edwards, male    DOB: 1960/12/30,  MRN: [id number] ? ?Chief Complaint  ?Patient presents with  ? Routine Post Op  ? ? ?DOS: 06/20/2021 ?Procedure: Left partial first ray amputation primarily closed ? ?61 y.o. male returns for post-op check.  Patient states that he is doing well.  He states is doing a lot better.  He states his skin has completely healed.  Has been doing Betadine wet-to-dry dressing change.  He denies any other acute complaints.  He still has some residual pain. ? ?Review of Systems: Negative except as noted in the HPI. Denies N/V/F/Ch. ? ?Past Medical History:  ?Diagnosis Date  ? Diabetes mellitus without complication ([place])   ? Hypertension   ? ? ?Current Outpatient Medications:  ?  amLODipine (NORVASC) 10 MG tablet, Take 10 mg by mouth daily., Disp: , Rfl:  ?  atorvastatin (LIPITOR) 80 MG tablet, Take 80 mg by mouth daily., Disp: , Rfl:  ?  chlorthalidone (HYGROTON) 25 MG tablet, Take 1 tablet (25 mg total) by mouth daily., Disp: 90 tablet, Rfl: 0 ?  insulin glargine (LANTUS) 100 UNIT/ML Solostar Pen, Inject 40 Units into the skin at bedtime., Disp: 36 mL, Rfl: 0 ?  metFORMIN (GLUCOPHAGE-XR) 500 MG 24 hr tablet, Take 4 tablets (2,000 mg total) by mouth daily with breakfast., Disp: 120 tablet, Rfl: 2 ?  mupirocin ointment (BACTROBAN) 2 %, Apply 1 application topically daily. (Patient not taking: Reported on 06/17/2021), Disp: 30 g, Rfl: 2 ?  OVER THE COUNTER MEDICATION, Take 1 capsule by mouth daily. CLA- body fat reduction, Disp: , Rfl:  ?  oxyCODONE (ROXICODONE) 5 MG immediate release tablet, Take 1 tablet (5 mg total) by mouth every 8 (eight) hours as needed., Disp: 15 tablet, Rfl: 0 ?  sildenafil (VIAGRA) 100 MG tablet, Take 100 mg by mouth daily as needed for erectile dysfunction., Disp: , Rfl:  ?  valsartan (DIOVAN) 160 MG tablet, Take 320 mg by mouth daily., Disp: , Rfl:  ? ?Social History  ? ?Tobacco Use  ?Smoking Status Never  ?Smokeless Tobacco Never   ? ? ?No Known Allergies ?Objective:  ?There were no vitals filed for this visit. ?There is no height or weight on file to calculate BMI. ?Constitutional Well developed. ?Well nourished.  ?Vascular Foot warm and well perfused. ?Capillary refill normal to all digits.   ?Neurologic Normal speech. ?Oriented to person, place, and time. ?Epicritic sensation to light touch grossly present bilaterally.  ?Dermatologic Moses skin incision has reepithelialized.  Skin completely reepithelialized.  No signs of Deis is noted.  No complication noted.  ?Orthopedic: No tenderness to palpation noted about the surgical site.  ? ?Radiographs: 3 views of skeletally mature adult left foot: 3 views of skeletally mature adult left foot: Partial first ray amputation noted.  History of partial fifth ray amputation ?Assessment:  ? ?No diagnosis found. ? ? ?Plan:  ?Patient was evaluated and treated and all questions answered. ? ?S/p foot surgery left ?-Progressing as expected post-operatively. ?-XR: See above ?-WB Status: Nonweightbearing in left lower extremity ?-Sutures: None ?-Medications: None ?-The wound has completely reepithelialized.  However it is still brittle skin.  Patient will need to request out of work for another 6 weeks.  He can slowly transition to going back to work afterwards. ? 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  Given  patient foot structure in the setting of Planter fasciitis I believe patient will benefit from custom-made orthotics to help control the hindfoot motion support the arch of the foot and take the stress away from plantar fascial.  Patient agrees with the plan like to proceed with orthotics ?-Patient was casted for orthotics with filler if indicated. ? ? ?No follow-ups on file.  ?

## 2021-08-27 ENCOUNTER — Ambulatory Visit: Payer: BC Managed Care – PPO

## 2021-08-27 DIAGNOSIS — E11628 Type 2 diabetes mellitus with other skin complications: Secondary | ICD-10-CM

## 2021-08-27 DIAGNOSIS — Q666 Other congenital valgus deformities of feet: Secondary | ICD-10-CM

## 2021-08-27 NOTE — Progress Notes (Signed)
SITUATION: ?Reason for Visit: Fitting and Delivery of Custom Fabricated Foot Orthoses ?Patient Report: Patient reports comfort and is satisfied with device. ? ?OBJECTIVE DATA: ?Patient History / Diagnosis:   ?  ICD-10-CM   ?1. Pes planovalgus  Q66.6   ?  ?2. Type 2 diabetes mellitus with left diabetic foot infection (HCC)  E11.628   ? L08.9   ?  ? ? ?Provided Device:  Custom Functional Foot Orthotics ?    RicheyLAB: [id number] ? ?GOAL OF ORTHOSIS ?- Improve gait ?- Decrease energy expenditure ?- Improve Balance ?- Provide Triplanar stability of foot complex ?- Facilitate motion ? ?ACTIONS PERFORMED ?Patient was fit with foot orthotics trimmed to shoe last. Patient tolerated fittign procedure.  ? ?Patient was provided with verbal and written instruction and demonstration regarding donning, doffing, wear, care, proper fit, function, purpose, cleaning, and use of the orthosis and in all related precautions and risks and benefits regarding the orthosis. ? ?Patient was also provided with verbal instruction regarding how to report any failures or malfunctions of the orthosis and necessary follow up care. Patient was also instructed to contact our office regarding any change in status that may affect the function of the orthosis. ? ?Patient demonstrated independence with proper donning, doffing, and fit and verbalized understanding of all instructions. ? ?PLAN: ?Patient is to follow up in one week or as necessary (PRN). All questions were answered and concerns addressed. Plan of care was discussed with and agreed upon by the patient. ? ?

## 2021-09-10 ENCOUNTER — Ambulatory Visit (INDEPENDENT_AMBULATORY_CARE_PROVIDER_SITE_OTHER): Payer: BC Managed Care – PPO | Admitting: Podiatry

## 2021-09-10 DIAGNOSIS — L089 Local infection of the skin and subcutaneous tissue, unspecified: Secondary | ICD-10-CM

## 2021-09-10 DIAGNOSIS — L97424 Non-pressure chronic ulcer of left heel and midfoot with necrosis of bone: Secondary | ICD-10-CM

## 2021-09-10 DIAGNOSIS — E11628 Type 2 diabetes mellitus with other skin complications: Secondary | ICD-10-CM

## 2021-09-10 DIAGNOSIS — Z9889 Other specified postprocedural states: Secondary | ICD-10-CM

## 2021-09-10 DIAGNOSIS — E11621 Type 2 diabetes mellitus with foot ulcer: Secondary | ICD-10-CM

## 2021-09-15 NOTE — Progress Notes (Signed)
Subjective:  Patient ID: Ernest Edwards, male    DOB: 07-27-1960,  MRN: [id number]  Chief Complaint  Patient presents with   Routine Post Op    POV #4 DOSPartial First Ray Amputation, left foot    DOS: 06/20/2021 Procedure: Left partial first ray amputation primarily closed  61 y.o. male returns for post-op check.  Patient states that he is doing well.  He states is doing a lot better.  H he is ready go back to work.  He has been more to forefoot/amputation site.  He denies any other acute complaints  Review of Systems: Negative except as noted in the HPI. Denies N/V/F/Ch.  Past Medical History:  Diagnosis Date   Diabetes mellitus without complication ([place])    Hypertension     Current Outpatient Medications:    amLODipine (NORVASC) 10 MG tablet, Take 10 mg by mouth daily., Disp: , Rfl:    atorvastatin (LIPITOR) 80 MG tablet, Take 80 mg by mouth daily., Disp: , Rfl:    chlorthalidone (HYGROTON) 25 MG tablet, Take 1 tablet (25 mg total) by mouth daily., Disp: 90 tablet, Rfl: 0   insulin glargine (LANTUS) 100 UNIT/ML Solostar Pen, Inject 40 Units into the skin at bedtime., Disp: 36 mL, Rfl: 0   metFORMIN (GLUCOPHAGE-XR) 500 MG 24 hr tablet, Take 4 tablets (2,000 mg total) by mouth daily with breakfast., Disp: 120 tablet, Rfl: 2   mupirocin ointment (BACTROBAN) 2 %, Apply 1 application topically daily. (Patient not taking: Reported on 06/17/2021), Disp: 30 g, Rfl: 2   OVER THE COUNTER MEDICATION, Take 1 capsule by mouth daily. CLA- body fat reduction, Disp: , Rfl:    oxyCODONE (ROXICODONE) 5 MG immediate release tablet, Take 1 tablet (5 mg total) by mouth every 8 (eight) hours as needed., Disp: 15 tablet, Rfl: 0   sildenafil (VIAGRA) 100 MG tablet, Take 100 mg by mouth daily as needed for erectile dysfunction., Disp: , Rfl:    valsartan (DIOVAN) 160 MG tablet, Take 320 mg by mouth daily., Disp: , Rfl:   Social History   Tobacco Use  Smoking Status Never  Smokeless Tobacco Never     No Known Allergies Objective:  There were no vitals filed for this visit. There is no height or weight on file to calculate BMI. Constitutional Well developed. Well nourished.  Vascular Foot warm and well perfused. Capillary refill normal to all digits.   Neurologic Normal speech. Oriented to person, place, and time. Epicritic sensation to light touch grossly present bilaterally.  Dermatologic Moses skin incision has reepithelialized.  Skin completely reepithelialized.  No signs of Deis is noted.  No complication noted.  Orthopedic: No tenderness to palpation noted about the surgical site.   Radiographs: 3 views of skeletally mature adult left foot: 3 views of skeletally mature adult left foot: Partial first ray amputation noted.  History of partial fifth ray amputation Assessment:   No diagnosis found.   Plan:  Patient was evaluated and treated and all questions answered.  S/p foot surgery left -Clinically the wound has completely reepithelialized.  He can return to regular work and activities.  No further pain.  No acute reservations.  At this time I discussed shoe gear modification and offloading and padding protecting he states understanding.  If any foot and ankle issues arise in the future of asked him to come back and see me.  Pes planovalgus with history of partial first ray amputation -I explained to patient the etiology of pes planovalgus and relationship  with Planter fasciitis and various treatment options were discussed.  Given patient foot structure in the setting of Planter fasciitis I believe patient will benefit from custom-made orthotics to help control the hindfoot motion support the arch of the foot and take the stress away from plantar fascial.  Patient agrees with the plan like to proceed with orthotics -Patient is awaiting a diabetic shoe with filler   No follow-ups on file.

## 2021-09-20 ENCOUNTER — Encounter (HOSPITAL_COMMUNITY): Payer: Self-pay

## 2021-09-20 ENCOUNTER — Ambulatory Visit (HOSPITAL_COMMUNITY)
Admission: EM | Admit: 2021-09-20 | Discharge: 2021-09-20 | Disposition: A | Discharge status: Home / Self Care | Payer: BC Managed Care – PPO | Attending: Family Medicine | Admitting: Family Medicine

## 2021-09-20 DIAGNOSIS — E1165 Type 2 diabetes mellitus with hyperglycemia: Secondary | ICD-10-CM

## 2021-09-20 DIAGNOSIS — E1159 Type 2 diabetes mellitus with other circulatory complications: Secondary | ICD-10-CM

## 2021-09-20 DIAGNOSIS — I10 Essential (primary) hypertension: Secondary | ICD-10-CM | POA: Diagnosis not present

## 2021-09-20 DIAGNOSIS — E0865 Diabetes mellitus due to underlying condition with hyperglycemia: Secondary | ICD-10-CM

## 2021-09-20 LAB — CBG MONITORING, ED: Glucose-Capillary: 152 mg/dL — ABNORMAL HIGH (ref 70–99)

## 2021-09-20 MED ORDER — METFORMIN HCL ER 500 MG PO TB24: 2000.0000 mg | ORAL_TABLET | Freq: Every day | ORAL | 2 refills | Status: DC

## 2021-09-20 NOTE — Discharge Instructions (Signed)
Labs Reviewed  CBG MONITORING, ED - Abnormal; Notable for the following components:      Result Value   Glucose-Capillary 152 (*)    All other components within normal limits   Your blood pressure was noted to be elevated during your visit today. If you are currently taking medication for high blood pressure, please ensure you are taking this as directed. If you do not have a history of high blood pressure and your blood pressure remains persistently elevated, you may need to begin taking a medication at some point. You may return here within the next few days to recheck if unable to see your primary care provider or if you do not have a one.  BP (!) 147/104 (BP Location: Left Arm)   Pulse 84   Temp 98.1 F (36.7 C) (Oral)   Resp 16   Ht 6\' 2"  (1.88 m)   Wt 120.2 kg   SpO2 97%   BMI 34.02 kg/m   BP Readings from Last 3 Encounters:  09/20/21 (!) 147/104  07/08/21 (!) 127/94  06/22/21 (!) 142/97

## 2021-09-20 NOTE — ED Triage Notes (Signed)
Patient has been out of Metformin for 4 days. Patient's blood sugar has been around 200.

## 2021-09-22 NOTE — ED Provider Notes (Signed)
[facility]   [id number] 09/20/21 Arrival Time: 1000  ASSESSMENT & PLAN:  1. Diabetes mellitus due to underlying condition with hyperglycemia, without long-term current use of insulin (HCC)   2. Elevated blood pressure reading in office with diagnosis of hypertension    Labs Reviewed  CBG MONITORING, ED - Abnormal; Notable for the following components:      Result Value   Glucose-Capillary 152 (*)    All other components within normal limits   Meds ordered this encounter  Medications   metFORMIN (GLUCOPHAGE-XR) 500 MG 24 hr tablet    Sig: Take 4 tablets (2,000 mg total) by mouth daily with breakfast.    Dispense:  120 tablet    Refill:  2      Discharge Instructions       Labs Reviewed  CBG MONITORING, ED - Abnormal; Notable for the following components:      Result Value   Glucose-Capillary 152 (*)    All other components within normal limits   Your blood pressure was noted to be elevated during your visit today. If you are currently taking medication for high blood pressure, please ensure you are taking this as directed. If you do not have a history of high blood pressure and your blood pressure remains persistently elevated, you may need to begin taking a medication at some point. You may return here within the next few days to recheck if unable to see your primary care provider or if you do not have a one.  BP (!) 147/104 (BP Location: Left Arm)   Pulse 84   Temp 98.1 F (36.7 C) (Oral)   Resp 16   Ht 6\' 2"  (1.88 m)   Wt 120.2 kg   SpO2 97%   BMI 34.02 kg/m   BP Readings from Last 3 Encounters:  09/20/21 (!) 147/104  07/08/21 (!) 127/94  06/22/21 (!) 142/97          Follow-up Information     Schedule an appointment as soon as possible for a visit  with [facility].   Contact information: [address] [place] [phone]                 Reviewed expectations re: course of current medical issues.  Questions answered. Outlined signs and symptoms indicating need for more acute intervention. Understanding verbalized. After Visit Summary given.   SUBJECTIVE: History from: Patient. Ernest Edwards is a 61 y.o. male. Reports: about to run out of diabetes medication; sugars in the 200s sometimes. No specific symptoms. Normal PO intake without n/v/d.  Increased blood pressure noted today. Reports that he has been treated for hypertension in the past.  He reports taking medications as instructed, no chest pain on exertion, no dyspnea on exertion, no swelling of ankles, no orthostatic dizziness or lightheadedness, no orthopnea or paroxysmal nocturnal dyspnea, and no palpitations.   OBJECTIVE:  Vitals:   09/20/21 1144 09/20/21 1146  BP: (!) 147/104   Pulse: 84   Resp: 16   Temp: 98.1 F (36.7 C)   TempSrc: Oral   SpO2: 97%   Weight:  120.2 kg  Height:  6\' 2"  (1.88 m)    General appearance: alert; no distress Eyes: PERRLA; EOMI; conjunctiva normal HENT: [place]; AT; without nasal congestion Neck: supple  Lungs: speaks full sentences without difficulty; unlabored Extremities: no edema Skin: warm and dry Neurologic: normal gait Psychological: alert and cooperative; normal mood and affect  Labs: Results for  orders placed or performed during the hospital encounter of 09/20/21  POC CBG monitoring  Result Value Ref Range   Glucose-Capillary 152 (H) 70 - 99 mg/dL   Labs Reviewed  CBG MONITORING, ED - Abnormal; Notable for the following components:      Result Value   Glucose-Capillary 152 (*)    All other components within normal limits    No Known Allergies  Past Medical History:  Diagnosis Date   Diabetes mellitus without complication (HCC)    Hypertension    Social History   Socioeconomic History   Marital status: Divorced    Spouse name: Not on file   Number of children: Not on file   Years of education: Not on file   Highest education level: Not on file   Occupational History   Not on file  Tobacco Use   Smoking status: Never   Smokeless tobacco: Never  Vaping Use   Vaping Use: Never used  Substance and Sexual Activity   Alcohol use: No   Drug use: No   Sexual activity: Not on file  Other Topics Concern   Not on file  Social History Narrative   Not on file   Social Determinants of Health   Financial Resource Strain: Not on file  Food Insecurity: Not on file  Transportation Needs: Not on file  Physical Activity: Not on file  Stress: Not on file  Social Connections: Not on file  Intimate Partner Violence: Not on file   Family History  Problem Relation Age of Onset   Hypertension Mother    Diabetes Mother    Diabetes Father    Hypertension Father    Past Surgical History:  Procedure Laterality Date   AMPUTATION Left 06/20/2021   Procedure: Partial First Ray Amputation, left foot;  Surgeon: Candelaria Stagers, DPM;  Location: [facility] OR;  Service: Podiatry;  Laterality: Left;     Mardella Layman, MD 09/22/21 1110

## 2021-09-29 ENCOUNTER — Ambulatory Visit (INDEPENDENT_AMBULATORY_CARE_PROVIDER_SITE_OTHER): Payer: BC Managed Care – PPO | Admitting: Podiatry

## 2021-09-29 ENCOUNTER — Encounter: Payer: Self-pay | Admitting: Podiatry

## 2021-09-29 DIAGNOSIS — R601 Generalized edema: Secondary | ICD-10-CM

## 2021-10-01 NOTE — Progress Notes (Signed)
  Subjective:  Patient ID: Ernest Edwards, male    DOB: September 28, 1960,  MRN: [id number]  Chief Complaint  Patient presents with   Routine Post Op    61 y.o. male presents with the above complaint.  Patient presents with complaint of swelling to the left foot.  Patient states he is worried about some swelling started few days ago it is not causing him any discomfort or pain.  He has healed the amputation site without any acute problems.  He has been doing more stuff in his regular shoes.  He wanted to make sure the swelling and swelling is nothing concerning   Review of Systems: Negative except as noted in the HPI. Denies N/V/F/Ch.  Past Medical History:  Diagnosis Date   Diabetes mellitus without complication (HCC)    Hypertension     Current Outpatient Medications:    amLODipine (NORVASC) 10 MG tablet, Take 10 mg by mouth daily., Disp: , Rfl:    atorvastatin (LIPITOR) 80 MG tablet, Take 80 mg by mouth daily., Disp: , Rfl:    chlorthalidone (HYGROTON) 25 MG tablet, Take 1 tablet (25 mg total) by mouth daily., Disp: 90 tablet, Rfl: 0   insulin glargine (LANTUS) 100 UNIT/ML Solostar Pen, Inject 40 Units into the skin at bedtime., Disp: 36 mL, Rfl: 0   metFORMIN (GLUCOPHAGE-XR) 500 MG 24 hr tablet, Take 4 tablets (2,000 mg total) by mouth daily with breakfast., Disp: 120 tablet, Rfl: 2   mupirocin ointment (BACTROBAN) 2 %, Apply 1 application topically daily., Disp: 30 g, Rfl: 2   OVER THE COUNTER MEDICATION, Take 1 capsule by mouth daily. CLA- body fat reduction, Disp: , Rfl:    sildenafil (VIAGRA) 100 MG tablet, Take 100 mg by mouth daily as needed for erectile dysfunction., Disp: , Rfl:    valsartan (DIOVAN) 160 MG tablet, Take 320 mg by mouth daily., Disp: , Rfl:   Social History   Tobacco Use  Smoking Status Never  Smokeless Tobacco Never    No Known Allergies Objective:  There were no vitals filed for this visit. There is no height or weight on file to calculate  BMI. Constitutional Well developed. Well nourished.  Vascular Dorsalis pedis pulses palpable bilaterally. Posterior tibial pulses palpable bilaterally. Capillary refill normal to all digits.  No cyanosis or clubbing noted. Pedal hair growth normal.  Neurologic Normal speech. Oriented to person, place, and time. Epicritic sensation to light touch grossly present bilaterally.  Dermatologic 1+ nonpitting pitting edema noted to the left dorsal foot.  Generalized swelling.  No swelling specifically around the amputation site.  No redness noted no signs of abscess or fluctuance noted.  Orthopedic: Normal joint ROM without pain or crepitus bilaterally. No visible deformities. No bony tenderness.   Radiographs: None Assessment:  No diagnosis found. Plan:  Patient was evaluated and treated and all questions answered.  Left generalized edema -All questions and concerns were discussed with the patient in extensive detail this is likely due to being in a dependent position for a long period of time I discussed elevation as well as compression socks.  Compression anklet was dispensed.  I also discussed compression socks as well.  He states understanding will obtain them.  No follow-ups on file.

## 2021-10-22 ENCOUNTER — Encounter (HOSPITAL_COMMUNITY): Payer: Self-pay | Admitting: Emergency Medicine

## 2021-10-22 ENCOUNTER — Ambulatory Visit (HOSPITAL_COMMUNITY)
Admission: EM | Admit: 2021-10-22 | Discharge: 2021-10-22 | Disposition: A | Discharge status: Home / Self Care | Payer: BC Managed Care – PPO | Attending: Internal Medicine | Admitting: Internal Medicine

## 2021-10-22 DIAGNOSIS — Z760 Encounter for issue of repeat prescription: Secondary | ICD-10-CM | POA: Diagnosis not present

## 2021-10-22 DIAGNOSIS — I10 Essential (primary) hypertension: Secondary | ICD-10-CM | POA: Diagnosis not present

## 2021-10-22 DIAGNOSIS — E1159 Type 2 diabetes mellitus with other circulatory complications: Secondary | ICD-10-CM

## 2021-10-22 MED ORDER — BLOOD GLUCOSE TEST VI STRP: ORAL_STRIP | 0 refills | Status: DC

## 2021-10-22 MED ORDER — VALSARTAN 160 MG PO TABS: 320.0000 mg | ORAL_TABLET | Freq: Every day | ORAL | 0 refills | Status: DC

## 2021-10-22 MED ORDER — AMLODIPINE BESYLATE 10 MG PO TABS: 10.0000 mg | ORAL_TABLET | Freq: Every day | ORAL | 0 refills | Status: DC

## 2021-10-22 MED ORDER — CHLORTHALIDONE 25 MG PO TABS: 25.0000 mg | ORAL_TABLET | Freq: Every day | ORAL | 0 refills | Status: DC

## 2021-10-22 MED ORDER — ATORVASTATIN CALCIUM 80 MG PO TABS: 80.0000 mg | ORAL_TABLET | Freq: Every day | ORAL | 0 refills | Status: DC

## 2021-10-22 NOTE — ED Provider Notes (Signed)
[facility]    CSN: [id number] Arrival date & time: 10/22/21  1317      History   Chief Complaint Chief Complaint  Patient presents with   Medication Refill   Blood Pressure Check    HPI Ernest Edwards is a 60 y.o. male.   Patient presents to urgent care for a medication refill.  He states that at his last appointment in June, he was set up for an appointment for primary care through [facility] but has not received a phone call to schedule an appointment.  He is requesting refill on his blood pressure medications and his glucose test strips as he is a diabetic and has hypertension.  He states that he checks his blood pressure once daily at home and usually writes it down, but sometimes forgets.  He is without complaint today and simply wants his medications refilled.    Medication Refill   Past Medical History:  Diagnosis Date   Diabetes mellitus without complication (HCC)    Hypertension     Patient Active Problem List   Diagnosis Date Noted   Diabetic foot ulcer (HCC) 06/18/2021   Toe osteomyelitis, left (HCC) 06/18/2021   Uncontrolled type 2 diabetes mellitus with hyperglycemia, with long-term current use of insulin (HCC) 06/18/2021   Essential hypertension 06/18/2021   AKI (acute kidney injury) (HCC) 06/18/2021   Dyslipidemia 06/18/2021   Obesity, Class II, BMI 35-39.9 06/18/2021   Ulcer of foot (HCC) 08/26/2020   Persistent proteinuria associated with type 2 diabetes mellitus (HCC) 08/23/2019   ED (erectile dysfunction) 08/21/2019   Deformity of left foot 08/16/2019   History of amputation of lesser toe, left (HCC) 08/16/2019   Type 2 diabetes mellitus with circulatory disorder, with long-term current use of insulin (HCC) 08/18/2016   Back pain 03/12/2014   Family history of premature CAD 03/12/2014   Hypertension associated with diabetes (HCC) 03/12/2014    Past Surgical History:  Procedure Laterality Date   AMPUTATION Left 06/20/2021    Procedure: Partial First Ray Amputation, left foot;  Surgeon: Candelaria Stagers, DPM;  Location: [facility];  Service: Podiatry;  Laterality: Left;       Home Medications    Prior to Admission medications   Medication Sig Start Date End Date Taking? Authorizing Provider  Glucose Blood (BLOOD GLUCOSE TEST STRIPS) STRP Check blood sugars 3 times daily.  Diagnosis E11.9 patient on insulin. 10/22/21  Yes Ernest Beers, FNP  amLODipine (NORVASC) 10 MG tablet Take 1 tablet (10 mg total) by mouth daily. 10/22/21   Ernest Beers, FNP  atorvastatin (LIPITOR) 80 MG tablet Take 1 tablet (80 mg total) by mouth daily. 10/22/21   Ernest Beers, FNP  chlorthalidone (HYGROTON) 25 MG tablet Take 1 tablet (25 mg total) by mouth daily. 10/22/21   Ernest Beers, FNP  insulin glargine (LANTUS) 100 UNIT/ML Solostar Pen Inject 40 Units into the skin at bedtime. 11/17/20 06/17/21  Wieters, Hallie C, PA-C  metFORMIN (GLUCOPHAGE-XR) 500 MG 24 hr tablet Take 4 tablets (2,000 mg total) by mouth daily with breakfast. 09/20/21 12/19/21  Mardella Layman, MD  mupirocin ointment (BACTROBAN) 2 % Apply 1 application topically daily. 03/04/21   McDonald, Rachelle Hora, DPM  OVER THE COUNTER MEDICATION Take 1 capsule by mouth daily. CLA- body fat reduction    [provider]  sildenafil (VIAGRA) 100 MG tablet Take 100 mg by mouth daily as needed for erectile dysfunction. 04/06/21   [provider]  valsartan (DIOVAN)  160 MG tablet Take 2 tablets (320 mg total) by mouth daily. 10/22/21   Ernest Beers, FNP  hydrochlorothiazide (MICROZIDE) 12.5 MG capsule Take 1 capsule (12.5 mg total) by mouth daily. 02/21/14 05/20/19  [name], PA-C  lisinopril (ZESTRIL) 10 MG tablet Take 1 tablet (10 mg total) by mouth daily. 05/20/19 05/20/19  [name], NP    Family History Family History  Problem Relation Age of Onset   Hypertension Mother    Diabetes Mother    Diabetes Father    Hypertension Father      Social History Social History   Tobacco Use   Smoking status: Never   Smokeless tobacco: Never  Vaping Use   Vaping Use: Never used  Substance Use Topics   Alcohol use: No   Drug use: No     Allergies   Patient has no known allergies.   Review of Systems Review of Systems Per HPI  Physical Exam Triage Vital Signs ED Triage Vitals  Enc Vitals Group     BP 10/22/21 1352 (!) 148/96     Pulse Rate 10/22/21 1352 87     Resp 10/22/21 1352 18     Temp 10/22/21 1352 98.1 F (36.7 C)     Temp Source 10/22/21 1352 Oral     SpO2 10/22/21 1352 96 %     Weight --      Height --      Head Circumference --      Peak Flow --      Pain Score 10/22/21 1351 0     Pain Loc --      Pain Edu? --      Excl. in GC? --    No data found.  Updated Vital Signs BP (!) 148/96 (BP Location: Left Arm)   Pulse 87   Temp 98.1 F (36.7 C) (Oral)   Resp 18   SpO2 96%   Visual Acuity Right Eye Distance:   Left Eye Distance:   Bilateral Distance:    Right Eye Near:   Left Eye Near:    Bilateral Near:     Physical Exam Vitals and nursing note reviewed.  Constitutional:      Appearance: Normal appearance. He is not ill-appearing or toxic-appearing.     Comments: Very pleasant patient sitting on exam in position of comfort table in no acute distress.   HENT:     Head: Normocephalic and atraumatic.     Right Ear: Hearing and external ear normal.     Left Ear: Hearing and external ear normal.     Nose: Nose normal.     Mouth/Throat:     Lips: Pink.     Mouth: Mucous membranes are moist.  Eyes:     General: Lids are normal. Vision grossly intact. Gaze aligned appropriately.     Extraocular Movements: Extraocular movements intact.     Conjunctiva/sclera: Conjunctivae normal.  Cardiovascular:     Rate and Rhythm: Normal rate and regular rhythm.     Heart sounds: Normal heart sounds, S1 normal and S2 normal.  Pulmonary:     Effort: Pulmonary effort is normal. No respiratory  distress.     Breath sounds: Normal breath sounds and air entry.  Abdominal:     Palpations: Abdomen is soft.  Musculoskeletal:     Cervical back: Neck supple.  Skin:    General: Skin is warm and dry.     Capillary Refill: Capillary refill takes less than 2 seconds.  Findings: No rash.  Neurological:     General: No focal deficit present.     Mental Status: He is alert and oriented to person, place, and time. Mental status is at baseline.     Cranial Nerves: No dysarthria or facial asymmetry.     Gait: Gait is intact.  Psychiatric:        Mood and Affect: Mood normal.        Speech: Speech normal.        Behavior: Behavior normal.        Thought Content: Thought content normal.        Judgment: Judgment normal.      UC Treatments / Results  Labs (all labs ordered are listed, but only abnormal results are displayed) Labs Reviewed - No data to display  EKG   Radiology No results found.  Procedures Procedures (including critical care time)  Medications Ordered in UC Medications - No data to display  Initial Impression / Assessment and Plan / UC Course  I have reviewed the triage vital signs and the nursing notes.  Pertinent labs & imaging results that were available during my care of the patient were reviewed by me and considered in my medical decision making (see chart for details).  1.  Medication refill Requested medications refilled.  Patient to go to primary care provider appointment on July 26 with [facility] that was set up in the clinic today. Patient encouraged to continue use of medications as prescribed and check his blood sugars 3 times daily as he is on Lantus insulin.  Discussed physical exam and available lab work findings in clinic with patient.  Counseled patient regarding appropriate use of medications and potential side effects for all medications recommended or prescribed today. Discussed red flag signs and symptoms of worsening  condition,when to call the PCP office, return to urgent care, and when to seek higher level of care in the emergency department. Patient verbalizes understanding and agreement with plan. All questions answered. Patient discharged in stable condition.  Final Clinical Impressions(s) / UC Diagnoses   Final diagnoses:  Medication refill     Discharge Instructions      Your medication refills have been sent to the pharmacy.  Go to your appointment on November 10, 2021 with [facility] for further evaluation and medication management.      ED Prescriptions     Medication Sig Dispense Auth. Provider   chlorthalidone (HYGROTON) 25 MG tablet Take 1 tablet (25 mg total) by mouth daily. 30 tablet Reita May M, FNP   amLODipine (NORVASC) 10 MG tablet Take 1 tablet (10 mg total) by mouth daily. 30 tablet Ernest Beers, FNP   atorvastatin (LIPITOR) 80 MG tablet Take 1 tablet (80 mg total) by mouth daily. 30 tablet Reita May M, FNP   valsartan (DIOVAN) 160 MG tablet Take 2 tablets (320 mg total) by mouth daily. 60 tablet Reita May M, [place]   Glucose Blood (BLOOD GLUCOSE TEST STRIPS) STRP Check blood sugars 3 times daily.  Diagnosis E11.9 patient on insulin. 100 strip Ernest Beers, FNP      PDMP not reviewed this encounter.   Ernest Edwards, [place] 10/22/21 1529

## 2021-10-22 NOTE — Discharge Instructions (Addendum)
Your medication refills have been sent to the pharmacy.  Go to your appointment on November 10, 2021 with [facility] for further evaluation and medication management.

## 2021-10-22 NOTE — ED Triage Notes (Addendum)
Pt presents to UC for a medication refill and blood pressure check.  Requesting a refill on test strips and a refill on Chlorthalidone 25 mg, Amlodipine 10 mg, Atorvastatin 80 mg and Valsartan 160 mg.

## 2021-11-10 ENCOUNTER — Ambulatory Visit: Payer: BC Managed Care – PPO | Admitting: Physician Assistant

## 2021-11-16 ENCOUNTER — Encounter: Payer: Self-pay | Admitting: Podiatry

## 2021-12-04 ENCOUNTER — Ambulatory Visit
Admission: EM | Admit: 2021-12-04 | Discharge: 2021-12-04 | Disposition: A | Discharge status: Home / Self Care | Payer: BC Managed Care – PPO | Attending: Urgent Care | Admitting: Urgent Care

## 2021-12-04 ENCOUNTER — Encounter: Payer: Self-pay | Admitting: Emergency Medicine

## 2021-12-04 DIAGNOSIS — I10 Essential (primary) hypertension: Secondary | ICD-10-CM

## 2021-12-04 DIAGNOSIS — E119 Type 2 diabetes mellitus without complications: Secondary | ICD-10-CM

## 2021-12-04 DIAGNOSIS — E785 Hyperlipidemia, unspecified: Secondary | ICD-10-CM

## 2021-12-04 DIAGNOSIS — Z760 Encounter for issue of repeat prescription: Secondary | ICD-10-CM

## 2021-12-04 DIAGNOSIS — Z794 Long term (current) use of insulin: Secondary | ICD-10-CM

## 2021-12-04 LAB — POCT FASTING CBG KUC MANUAL ENTRY: POCT Glucose (KUC): 110 mg/dL — AB (ref 70–99)

## 2021-12-04 MED ORDER — ATORVASTATIN CALCIUM 80 MG PO TABS: 80.0000 mg | ORAL_TABLET | Freq: Every day | ORAL | 0 refills | Status: DC

## 2021-12-04 MED ORDER — BLOOD GLUCOSE TEST VI STRP: ORAL_STRIP | 0 refills | Status: DC

## 2021-12-04 MED ORDER — AMLODIPINE BESYLATE 10 MG PO TABS: 10.0000 mg | ORAL_TABLET | Freq: Every day | ORAL | 0 refills | Status: DC

## 2021-12-04 MED ORDER — METFORMIN HCL ER 500 MG PO TB24: 2000.0000 mg | ORAL_TABLET | Freq: Every day | ORAL | 2 refills | Status: DC

## 2021-12-04 MED ORDER — CHLORTHALIDONE 25 MG PO TABS: 25.0000 mg | ORAL_TABLET | Freq: Every day | ORAL | 0 refills | Status: DC

## 2021-12-04 MED ORDER — VALSARTAN 320 MG PO TABS: 320.0000 mg | ORAL_TABLET | Freq: Every day | ORAL | 0 refills | Status: DC

## 2021-12-04 NOTE — ED Provider Notes (Signed)
[facility]   MRN: [id number] DOB: [birth year]  Subjective:   Ernest Edwards is a 61 y.o. male presenting for medication refills for his blood pressure, dyslipidemia, diabetes and glucose test strips.  Patient is currently in the process of setting up his primary care provider.  He is doing very well with his chronic conditions. No chest pain, abdominal pain, hematuria.   No current facility-administered medications for this encounter.  Current Outpatient Medications:    amLODipine (NORVASC) 10 MG tablet, Take 1 tablet (10 mg total) by mouth daily., Disp: 30 tablet, Rfl: 0   atorvastatin (LIPITOR) 80 MG tablet, Take 1 tablet (80 mg total) by mouth daily., Disp: 30 tablet, Rfl: 0   chlorthalidone (HYGROTON) 25 MG tablet, Take 1 tablet (25 mg total) by mouth daily., Disp: 30 tablet, Rfl: 0   Glucose Blood (BLOOD GLUCOSE TEST STRIPS) STRP, Check blood sugars 3 times daily.  Diagnosis E11.9 patient on insulin., Disp: 100 strip, Rfl: 0   insulin glargine (LANTUS) 100 UNIT/ML Solostar Pen, Inject 40 Units into the skin at bedtime., Disp: 36 mL, Rfl: 0   metFORMIN (GLUCOPHAGE-XR) 500 MG 24 hr tablet, Take 4 tablets (2,000 mg total) by mouth daily with breakfast., Disp: 120 tablet, Rfl: 2   mupirocin ointment (BACTROBAN) 2 %, Apply 1 application topically daily., Disp: 30 g, Rfl: 2   OVER THE COUNTER MEDICATION, Take 1 capsule by mouth daily. CLA- body fat reduction, Disp: , Rfl:    sildenafil (VIAGRA) 100 MG tablet, Take 100 mg by mouth daily as needed for erectile dysfunction., Disp: , Rfl:    valsartan (DIOVAN) 160 MG tablet, Take 2 tablets (320 mg total) by mouth daily., Disp: 60 tablet, Rfl: 0   No Known Allergies  Past Medical History:  Diagnosis Date   Diabetes mellitus without complication (HCC)    Hypertension      Past Surgical History:  Procedure Laterality Date   AMPUTATION Left 06/20/2021   Procedure: Partial First Ray Amputation, left foot;  Surgeon:  Candelaria Stagers, DPM;  Location: [facility];  Service: Podiatry;  Laterality: Left;    Family History  Problem Relation Age of Onset   Hypertension Mother    Diabetes Mother    Diabetes Father    Hypertension Father     Social History   Tobacco Use   Smoking status: Never   Smokeless tobacco: Never  Vaping Use   Vaping Use: Never used  Substance Use Topics   Alcohol use: No   Drug use: No    ROS   Objective:   Vitals: BP (!) 151/99   Pulse 81   Temp 98.6 F (37 C)   Resp 18   SpO2 98%   Physical Exam Constitutional:      General: He is not in acute distress.    Appearance: Normal appearance. He is well-developed. He is not ill-appearing, toxic-appearing or diaphoretic.  HENT:     Head: Normocephalic and atraumatic.     Right Ear: External ear normal.     Left Ear: External ear normal.     Nose: Nose normal.     Mouth/Throat:     Mouth: Mucous membranes are moist.  Eyes:     General: No scleral icterus.       Right eye: No discharge.        Left eye: No discharge.     Extraocular Movements: Extraocular movements intact.  Cardiovascular:     Rate and Rhythm:  Normal rate and regular rhythm.     Heart sounds: Normal heart sounds. No murmur heard.    No friction rub. No gallop.  Pulmonary:     Effort: Pulmonary effort is normal. No respiratory distress.     Breath sounds: Normal breath sounds. No stridor. No wheezing, rhonchi or rales.  Neurological:     Mental Status: He is alert and oriented to person, place, and time.  Psychiatric:        Mood and Affect: Mood normal.        Behavior: Behavior normal.        Thought Content: Thought content normal.     Results for orders placed or performed during the hospital encounter of 12/04/21 (from the past 24 hour(s))  POCT CBG (manual entry)     Status: Abnormal   Collection Time: 12/04/21  3:54 PM  Result Value Ref Range   POCT Glucose ([facility]) 110 (A) 70 - 99 mg/dL    Assessment and Plan :   PDMP not  reviewed this encounter.  1. Type 2 diabetes mellitus treated with insulin (HCC)   2. Essential hypertension   3. Dyslipidemia   4. Medication refill    Medication refill provided for 90 days. Encouraged patient to continue efforts at managing his chronic conditions. Counseled patient on potential for adverse effects with medications prescribed/recommended today, ER and return-to-clinic precautions discussed, patient verbalized understanding.    Wallis Bamberg, [place] 12/05/21 [phone]

## 2021-12-04 NOTE — Discharge Instructions (Signed)

## 2021-12-04 NOTE — ED Triage Notes (Addendum)
Pt requesting BP check, CBG check and refills on Chlorthalidone 25 mg, Amlodipine 10 mg, Atorvastatin 80 mg, Valsartan 160 mg, and Metformin as well as CBG Contour test strips.

## 2022-01-12 ENCOUNTER — Ambulatory Visit (INDEPENDENT_AMBULATORY_CARE_PROVIDER_SITE_OTHER): Payer: BC Managed Care – PPO | Admitting: Podiatry

## 2022-01-12 DIAGNOSIS — M2042 Other hammer toe(s) (acquired), left foot: Secondary | ICD-10-CM

## 2022-01-12 NOTE — Progress Notes (Signed)
Subjective:  Patient ID: Ernest Edwards, male    DOB: [birth year],  MRN: [id number]  Chief Complaint  Patient presents with   Foot Ulcer    61 y.o. male presents with the above complaint.  Patient presents for left second digit hammertoe/toe contracture.  Patient states that started notice some hyperkeratotic lesion at the distal tip.  He has a history of partial first ray amputation done by me.  He wanted to make sure that this not turning into an ulceration he lost appropriate prophylactic procedures to help keep the toe straight.  He denies seeing anyone else prior to seeing me.  Denies any other acute complaints he is a diabetic   Review of Systems: Negative except as noted in the HPI. Denies N/V/F/Ch.  Past Medical History:  Diagnosis Date   Diabetes mellitus without complication (HCC)    Hypertension     Current Outpatient Medications:    amLODipine (NORVASC) 10 MG tablet, Take 1 tablet (10 mg total) by mouth daily., Disp: 90 tablet, Rfl: 0   atorvastatin (LIPITOR) 80 MG tablet, Take 1 tablet (80 mg total) by mouth daily., Disp: 90 tablet, Rfl: 0   chlorthalidone (HYGROTON) 25 MG tablet, Take 1 tablet (25 mg total) by mouth daily., Disp: 90 tablet, Rfl: 0   Glucose Blood (BLOOD GLUCOSE TEST STRIPS) STRP, Check blood sugars 3 times daily.  Diagnosis E11.9 patient on insulin., Disp: 100 strip, Rfl: 0   insulin glargine (LANTUS) 100 UNIT/ML Solostar Pen, Inject 40 Units into the skin at bedtime., Disp: 36 mL, Rfl: 0   metFORMIN (GLUCOPHAGE-XR) 500 MG 24 hr tablet, Take 4 tablets (2,000 mg total) by mouth daily with breakfast., Disp: 120 tablet, Rfl: 2   mupirocin ointment (BACTROBAN) 2 %, Apply 1 application topically daily., Disp: 30 g, Rfl: 2   OVER THE COUNTER MEDICATION, Take 1 capsule by mouth daily. CLA- body fat reduction, Disp: , Rfl:    sildenafil (VIAGRA) 100 MG tablet, Take 100 mg by mouth daily as needed for erectile dysfunction., Disp: , Rfl:    valsartan (DIOVAN) 320  MG tablet, Take 1 tablet (320 mg total) by mouth daily., Disp: 90 tablet, Rfl: 0  Social History   Tobacco Use  Smoking Status Never  Smokeless Tobacco Never    No Known Allergies Objective:  There were no vitals filed for this visit. There is no height or weight on file to calculate BMI. Constitutional Well developed. Well nourished.  Vascular Dorsalis pedis pulses palpable bilaterally. Posterior tibial pulses palpable bilaterally. Capillary refill normal to all digits.  No cyanosis or clubbing noted. Pedal hair growth normal.  Neurologic Normal speech. Oriented to person, place, and time. Epicritic sensation to light touch grossly present bilaterally.  Dermatologic Nails well groomed and normal in appearance. No open wounds. No skin lesions.  Orthopedic: Pain on palpation to the left second digit hammertoe contracture semiflexible toe contracture noted.  Tight flexor tendon noted.  Distal tip callus noted   Radiographs: None Assessment:   1. 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  Left semiflexible hammertoe second digit -All questions and concerns were discussed with the patient in extensive detail -Clinically there is no open wound noted at this time however I discussed with the patient that if this continues to be bothersome we will plan on doing a flexor tenotomy during next medical visit.  He states understanding.  Especially if there is recurrence  of hyperkeratotic lesion without any relief  No follow-ups on file.

## 2022-02-09 ENCOUNTER — Encounter: Payer: BC Managed Care – PPO | Admitting: Podiatry

## 2022-02-16 ENCOUNTER — Ambulatory Visit (INDEPENDENT_AMBULATORY_CARE_PROVIDER_SITE_OTHER): Payer: BC Managed Care – PPO | Admitting: Podiatry

## 2022-02-16 DIAGNOSIS — M205X2 Other deformities of toe(s) (acquired), left foot: Secondary | ICD-10-CM

## 2022-02-16 NOTE — Progress Notes (Signed)
  Subjective:  Patient ID: Ernest Edwards, male    DOB: 1960/06/15,  MRN: [id number]  Chief Complaint  Patient presents with   Wound Check    Pt states he is concerned about how his toe is looking    61 y.o. male returns today for planned flexor tenotomy of the left second digit digit.  Objective:  There were no vitals filed for this visit.  General AA&O x3. Normal mood and affect.  Vascular Pedal pulses palpable.  Neurologic Epicritic sensation grossly intact.  Dermatologic Pre-ulcerative callus at the tip of the left, 2nd toe  Orthopedic: Semi-reducible hammertoe deformity left, 2nd toe    Assessment & Plan:  Patient was evaluated and treated and all questions answered.  Hammertoe left second toe with pre-ulcerative callus -Flexor tenotomy as below. -Advised to remove the dressing in 24 hours and apply a band-aid and triple abx ointment every day thereafter.  Procedure: Flexor Tenotomy Indication for Procedure: toe with semi-reducible hammertoe with distal tip ulceration. Flexor tenotomy indicated to alleviate contracture, reduce pressure, and enhance healing of the ulceration. Location: left, 2nd toe Anesthesia: Lidocaine 1% plain; 1.5 mL and Marcaine 0.5% plain; 1.5 mL digital block Instrumentation: #11 blade Technique: The toe was anesthetized as above and prepped in the usual fashion. The toe was exsanquinated and a tourniquet was secured at the base of the toe. An 11 blade needle was then used to percutaneously release the flexor tendon at the plantar surface of the toe with noted release of the hammertoe deformity. The incision was then dressed with antibiotic ointment and band-aid. Compression splint dressing applied. Patient tolerated the procedure well. Dressing: Dry, sterile, compression dressing. Disposition: Patient tolerated procedure well. Patient to return in 1 week for follow-up.      No follow-ups on file.

## 2022-02-26 ENCOUNTER — Ambulatory Visit
Admission: EM | Admit: 2022-02-26 | Discharge: 2022-02-26 | Disposition: A | Discharge status: Home / Self Care | Payer: BC Managed Care – PPO | Attending: Urgent Care | Admitting: Urgent Care

## 2022-02-26 DIAGNOSIS — Z760 Encounter for issue of repeat prescription: Secondary | ICD-10-CM

## 2022-02-26 MED ORDER — VALSARTAN 320 MG PO TABS: 320.0000 mg | ORAL_TABLET | Freq: Every day | ORAL | 0 refills | Status: DC

## 2022-02-26 MED ORDER — METFORMIN HCL ER 500 MG PO TB24: 2000.0000 mg | ORAL_TABLET | Freq: Every day | ORAL | 2 refills | Status: DC

## 2022-02-26 MED ORDER — CHLORTHALIDONE 25 MG PO TABS: 25.0000 mg | ORAL_TABLET | Freq: Every day | ORAL | 0 refills | Status: DC

## 2022-02-26 NOTE — ED Provider Notes (Signed)
[facility]  Note:  This document was prepared using Systems analyst and may include unintentional dictation errors.  MRN: [id number] DOB: 05/22/1960  Subjective:   Ernest Edwards is a 61 y.o. male presenting for medication refill.  Patient is still working on getting in with a new PCP but has found it difficult given insurance coverage is and who is excepting patients.  He would like a refill of his medication for his metformin, chlorthalidone, valsartan.  He is doing well with these medications and is managing his blood sugar well.  No chest pain, shortness of breath, diaphoresis, nausea, vomiting, abdominal pain.  No current facility-administered medications for this encounter.  Current Outpatient Medications:    amLODipine (NORVASC) 10 MG tablet, Take 1 tablet (10 mg total) by mouth daily., Disp: 90 tablet, Rfl: 0   atorvastatin (LIPITOR) 80 MG tablet, Take 1 tablet (80 mg total) by mouth daily., Disp: 90 tablet, Rfl: 0   chlorthalidone (HYGROTON) 25 MG tablet, Take 1 tablet (25 mg total) by mouth daily., Disp: 90 tablet, Rfl: 0   Glucose Blood (BLOOD GLUCOSE TEST STRIPS) STRP, Check blood sugars 3 times daily.  Diagnosis E11.9 patient on insulin., Disp: 100 strip, Rfl: 0   insulin glargine (LANTUS) 100 UNIT/ML Solostar Pen, Inject 40 Units into the skin at bedtime., Disp: 36 mL, Rfl: 0   metFORMIN (GLUCOPHAGE-XR) 500 MG 24 hr tablet, Take 4 tablets (2,000 mg total) by mouth daily with breakfast., Disp: 120 tablet, Rfl: 2   mupirocin ointment (BACTROBAN) 2 %, Apply 1 application topically daily., Disp: 30 g, Rfl: 2   OVER THE COUNTER MEDICATION, Take 1 capsule by mouth daily. CLA- body fat reduction, Disp: , Rfl:    sildenafil (VIAGRA) 100 MG tablet, Take 100 mg by mouth daily as needed for erectile dysfunction., Disp: , Rfl:    valsartan (DIOVAN) 320 MG tablet, Take 1 tablet (320 mg total) by mouth daily., Disp: 90 tablet, Rfl: 0   No Known  Allergies  Past Medical History:  Diagnosis Date   Diabetes mellitus without complication ([place])    Hypertension      Past Surgical History:  Procedure Laterality Date   AMPUTATION Left 06/20/2021   Procedure: Partial First Ray Amputation, left foot;  Surgeon: Felipa Furnace, DPM;  Location: [place];  Service: Podiatry;  Laterality: Left;    Family History  Problem Relation Age of Onset   Hypertension Mother    Diabetes Mother    Diabetes Father    Hypertension Father     Social History   Tobacco Use   Smoking status: Never   Smokeless tobacco: Never  Vaping Use   Vaping Use: Never used  Substance Use Topics   Alcohol use: No   Drug use: No    ROS   Objective:   Vitals: BP (!) 153/104 (BP Location: Right Arm)   Pulse 73   Temp 97.9 F (36.6 C) (Oral)   Resp 16   SpO2 98%   Physical Exam Constitutional:      General: He is not in acute distress.    Appearance: Normal appearance. He is well-developed and normal weight. He is not ill-appearing, toxic-appearing or diaphoretic.  HENT:     Head: Normocephalic and atraumatic.     Right Ear: External ear normal.     Left Ear: External ear normal.     Nose: Nose normal.     Mouth/Throat:     Pharynx: Oropharynx is  clear.  Eyes:     General: No scleral icterus.       Right eye: No discharge.        Left eye: No discharge.     Extraocular Movements: Extraocular movements intact.  Cardiovascular:     Rate and Rhythm: Normal rate.  Pulmonary:     Effort: Pulmonary effort is normal.  Musculoskeletal:     Cervical back: Normal range of motion.  Neurological:     Mental Status: He is alert and oriented to person, place, and time.  Psychiatric:        Mood and Affect: Mood normal.        Behavior: Behavior normal.        Thought Content: Thought content normal.        Judgment: Judgment normal.     Assessment and Plan :   PDMP not reviewed this encounter.  1. Encounter for medication refill     Patient  is stable, provided 90-day refill for his medications.  Encouraged patient to continue pursuing establishing care with a new PCP. Counseled patient on potential for adverse effects with medications prescribed/recommended today, ER and return-to-clinic precautions discussed, patient verbalized understanding.    Wallis Bamberg, PA-C 02/27/22 0800

## 2022-02-26 NOTE — ED Triage Notes (Signed)
Pt requesting rx metformin refill-500mg  x4 tabs every am-NAD-steady gait

## 2022-02-26 NOTE — Discharge Instructions (Signed)

## 2022-03-02 ENCOUNTER — Ambulatory Visit (INDEPENDENT_AMBULATORY_CARE_PROVIDER_SITE_OTHER): Payer: BC Managed Care – PPO | Admitting: Podiatry

## 2022-03-02 DIAGNOSIS — M205X2 Other deformities of toe(s) (acquired), left foot: Secondary | ICD-10-CM | POA: Diagnosis not present

## 2022-03-02 NOTE — Progress Notes (Signed)
  Subjective:  Patient ID: Ernest Edwards, male    DOB: [birth year],  MRN: [id number]  Chief Complaint  Patient presents with   Toe Pain   61 y.o. male returns today after undergoing flexor tenotomy of left second digit.  He states is doing well.  He has noticed reduction of pressure to the second toe.  Objective:  There were no vitals filed for this visit.  General AA&O x3. Normal mood and affect.  Vascular Pedal pulses palpable.  Neurologic Epicritic sensation grossly intact.  Dermatologic No further pre-ulcerative callus at the tip of the left, 2nd toe  Orthopedic: No further semi-reducible hammertoe deformity left, 2nd toe    Assessment & Plan:  Patient was evaluated and treated and all questions answered.  Hammertoe left second toe with pre-ulcerative callus -Clinically healed sutures were removed.  Rectus toe alignment noted.  At this time reduction of fourth toe pressure distal tip noted.  If any foot and ankle issues arise in the future of asked him to come back and see me.  I mention shoe gear modification as well.  He states understanding.    No follow-ups on file.

## 2022-03-28 ENCOUNTER — Telehealth: Payer: Self-pay

## 2022-03-28 ENCOUNTER — Ambulatory Visit
Admission: EM | Admit: 2022-03-28 | Discharge: 2022-03-28 | Discharge status: Against Medical Advice | Payer: BC Managed Care – PPO

## 2022-03-28 NOTE — Telephone Encounter (Addendum)
Patient states he is needing a refill on Atorvastatin 80 mg (patient made aware per the provider the urgent care is not able to refill the medication because of need to be re-evaluated by PCP [possible lab work, and the patient was last given a 3 month supply in August]. Nurse attempted to set patient up with PCP who could see him closer to today's date but patient refused and did not want assistance with MyChart or PCP finder handout. Patient educated on telehealth and the use of his assigned PCP oncall number. Patient does not display signs/ symptoms of distress.    The patient was made aware that we are happy to see him as a patient for his chief complaint at check in (HTN) but he states he was here for the medication refill. Patient said that is okay and left facility.

## 2022-05-25 ENCOUNTER — Ambulatory Visit (INDEPENDENT_AMBULATORY_CARE_PROVIDER_SITE_OTHER): Payer: BC Managed Care – PPO | Admitting: Family Medicine

## 2022-05-25 ENCOUNTER — Encounter: Payer: Self-pay | Admitting: Family Medicine

## 2022-05-25 VITALS — BP 128/79 | HR 87 | Temp 97.9°F | Ht 74.0 in | Wt 257.3 lb

## 2022-05-25 DIAGNOSIS — Z794 Long term (current) use of insulin: Secondary | ICD-10-CM

## 2022-05-25 DIAGNOSIS — Z7689 Persons encountering health services in other specified circumstances: Secondary | ICD-10-CM

## 2022-05-25 DIAGNOSIS — E1165 Type 2 diabetes mellitus with hyperglycemia: Secondary | ICD-10-CM | POA: Diagnosis not present

## 2022-05-25 DIAGNOSIS — I10 Essential (primary) hypertension: Secondary | ICD-10-CM

## 2022-05-25 DIAGNOSIS — N528 Other male erectile dysfunction: Secondary | ICD-10-CM | POA: Diagnosis not present

## 2022-05-25 LAB — POCT GLYCOSYLATED HEMOGLOBIN (HGB A1C): Hemoglobin A1C: 7 % — AB (ref 4.0–5.6)

## 2022-05-25 MED ORDER — BLOOD GLUCOSE TEST VI STRP: ORAL_STRIP | 3 refills | Status: AC

## 2022-05-25 MED ORDER — ATORVASTATIN CALCIUM 80 MG PO TABS: 80.0000 mg | ORAL_TABLET | Freq: Every day | ORAL | 1 refills | Status: DC

## 2022-05-25 MED ORDER — CHLORTHALIDONE 25 MG PO TABS: 25.0000 mg | ORAL_TABLET | Freq: Every day | ORAL | 1 refills | Status: DC

## 2022-05-25 MED ORDER — METFORMIN HCL ER 500 MG PO TB24: 2000.0000 mg | ORAL_TABLET | Freq: Every day | ORAL | 2 refills | Status: DC

## 2022-05-25 MED ORDER — SILDENAFIL CITRATE 100 MG PO TABS: 100.0000 mg | ORAL_TABLET | Freq: Every day | ORAL | 1 refills | Status: DC | PRN

## 2022-05-25 MED ORDER — VALSARTAN 320 MG PO TABS
320.0000 mg | ORAL_TABLET | Freq: Every day | ORAL | 1 refills | Status: DC
Start: 2022-05-25 — End: 2022-11-10

## 2022-05-25 MED ORDER — AMLODIPINE BESYLATE 10 MG PO TABS
10.0000 mg | ORAL_TABLET | Freq: Every day | ORAL | 1 refills | Status: DC
Start: 2022-05-25 — End: 2022-11-10

## 2022-05-25 NOTE — Progress Notes (Signed)
New Patient Office Visit  Subjective    Patient ID: Ernest Edwards, male    DOB: January 12, 1961  Age: 62 y.o. MRN: [id number]  CC:  Chief Complaint  Patient presents with   New Patient (Initial Visit)    Patient in office to est PCP - DM f/u and refill of medications- contour Next 2  test strip  and pen needles refill needed.     HPI Ernest Edwards presents to establish care  Diabetes Pt has been on Metformin 500mg  4 tabs po daily. He has been tolerating this dose well. Denies diarrhea or nausea. Use to also be on Lantus 40 units but ran out of pen needles. Hasn't been doing his Lantus for the last 4 months. On Atorvastatin 80 mg daily. 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 Defers foot exam today. 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 Reports he checks his feet daily. Pt reports he checks his sugars BID. He needs refills on his Contour next 2 strips.   Hypertension Taking Amlodipine 10mg , Chlorthalidone 25mg , Valsartan 320mg  daily. Denies chest pains or SOB.   Erectile Dysfunction Taking Sildenafil 100mg  prn. Not daily. Pt needs refills on all of his chronic maintenance medicines.   Outpatient Encounter Medications as of 05/25/2022  Medication Sig   OVER THE COUNTER MEDICATION Take 1 capsule by mouth daily. CLA- body fat reduction   [DISCONTINUED] amLODipine (NORVASC) 10 MG tablet Take 1 tablet (10 mg total) by mouth daily.   [DISCONTINUED] atorvastatin (LIPITOR) 80 MG tablet Take 1 tablet (80 mg total) by mouth daily.   [DISCONTINUED] chlorthalidone (HYGROTON) 25 MG tablet Take 1 tablet (25 mg total) by mouth daily.   [DISCONTINUED] Glucose Blood (BLOOD GLUCOSE TEST STRIPS) STRP Check blood sugars 3 times daily.  Diagnosis E11.9 patient on insulin.   [DISCONTINUED] metFORMIN (GLUCOPHAGE-XR) 500 MG 24 hr tablet Take 4 tablets (2,000 mg total) by mouth daily with breakfast.   [DISCONTINUED] sildenafil (VIAGRA) 100 MG tablet Take 100  mg by mouth daily as needed for erectile dysfunction.   [DISCONTINUED] valsartan (DIOVAN) 320 MG tablet Take 1 tablet (320 mg total) by mouth daily.   amLODipine (NORVASC) 10 MG tablet Take 1 tablet (10 mg total) by mouth daily.   atorvastatin (LIPITOR) 80 MG tablet Take 1 tablet (80 mg total) by mouth daily.   chlorthalidone (HYGROTON) 25 MG tablet Take 1 tablet (25 mg total) by mouth daily.   Glucose Blood (BLOOD GLUCOSE TEST STRIPS) STRP Check blood sugars 2 times daily.  Diagnosis E11.9 patient on insulin.   metFORMIN (GLUCOPHAGE-XR) 500 MG 24 hr tablet Take 4 tablets (2,000 mg total) by mouth daily with breakfast.   sildenafil (VIAGRA) 100 MG tablet Take 1 tablet (100 mg total) by mouth daily as needed for erectile dysfunction.   valsartan (DIOVAN) 320 MG tablet Take 1 tablet (320 mg total) by mouth daily.   [DISCONTINUED] hydrochlorothiazide (MICROZIDE) 12.5 MG capsule Take 1 capsule (12.5 mg total) by mouth daily.   [DISCONTINUED] insulin glargine (LANTUS) 100 UNIT/ML Solostar Pen Inject 40 Units into the skin at bedtime.   [DISCONTINUED] lisinopril (ZESTRIL) 10 MG tablet Take 1 tablet (10 mg total) by mouth daily.   [DISCONTINUED] mupirocin ointment (BACTROBAN) 2 % Apply 1 application topically daily. (Patient not taking: Reported on 05/25/2022)   No facility-administered encounter medications on file as of 05/25/2022.    Past Medical History:  Diagnosis Date   Diabetes mellitus without  complication [facility])    Hypertension     Past Surgical History:  Procedure Laterality Date   AMPUTATION Left 06/20/2021   Procedure: Partial First Ray Amputation, left foot;  Surgeon: Felipa Furnace, DPM;  Location: [place];  Service: Podiatry;  Laterality: Left;    Family History  Problem Relation Age of Onset   Hypertension Mother    Diabetes Mother    Diabetes Father    Hypertension Father    Heart attack Sister    Heart attack Brother     Social History   Socioeconomic History   Marital  status: Divorced    Spouse name: Not on file   Number of children: 1   Years of education: Not on file   Highest education level: Not on file  Occupational History   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[employer] [place]  Tobacco Use   Smoking status: Never   Smokeless tobacco: Never  Vaping Use   Vaping Use: Never used  Substance and Sexual Activity   Alcohol use: No   Drug use: No   Sexual activity: Not Currently  Other Topics Concern   Not on file  Social History Narrative   Not on file   Social Determinants of Health   Financial Resource Strain: Not on file  Food Insecurity: Not on file  Transportation Needs: Not on file  Physical Activity: Not on file  Stress: Not on file  Social Connections: Not on file  Intimate Partner Violence: Not on file    Review of Systems  All other systems reviewed and are negative.      Objective    BP 128/79   Pulse 87   Temp 97.9 F (36.6 C)   Ht 6\' 2"  (1.88 m)   Wt 257 lb 5 oz (116.7 kg)   SpO2 99%   BMI 33.04 kg/m   Physical Exam Vitals and nursing note reviewed.  Constitutional:      Appearance: Normal appearance. He is normal weight.  HENT:     Head: Normocephalic and atraumatic.     Right Ear: Tympanic membrane, ear canal and external ear normal.     Left Ear: Tympanic membrane, ear canal and external ear normal.     Nose: Nose normal.     Mouth/Throat:     Mouth: Mucous membranes are moist.     Pharynx: Oropharynx is clear.  Eyes:     Conjunctiva/sclera: Conjunctivae normal.     Pupils: Pupils are equal, round, and reactive to light.  Cardiovascular:     Rate and Rhythm: Normal rate and regular rhythm.     Pulses: Normal pulses.     Heart sounds: Normal heart sounds.  Pulmonary:     Effort: Pulmonary effort is normal.     Breath sounds: Normal breath sounds.  Abdominal:     General: Abdomen is flat.  Skin:    General: Skin is warm.     Capillary Refill: Capillary refill takes less than 2 seconds.  Neurological:      General: No focal deficit present.     Mental Status: He is alert and oriented to person, place, and time. Mental status is at baseline.  Psychiatric:        Mood and Affect: Mood normal.        Behavior: Behavior normal.        Thought Content: Thought content normal.        Judgment: Judgment normal.       Assessment & Plan:  Problem List Items Addressed This Visit       Cardiovascular and Mediastinum   Essential hypertension   Relevant Medications   sildenafil (VIAGRA) 100 MG tablet   amLODipine (NORVASC) 10 MG tablet   atorvastatin (LIPITOR) 80 MG tablet   chlorthalidone (HYGROTON) 25 MG tablet   valsartan (DIOVAN) 320 MG tablet     Endocrine   Uncontrolled type 2 diabetes mellitus with hyperglycemia, with long-term current use of insulin (HCC)   Relevant Medications   Glucose Blood (BLOOD GLUCOSE TEST STRIPS) STRP   metFORMIN (GLUCOPHAGE-XR) 500 MG 24 hr tablet   atorvastatin (LIPITOR) 80 MG tablet   chlorthalidone (HYGROTON) 25 MG tablet   valsartan (DIOVAN) 320 MG tablet   Other Relevant Orders   POCT glycosylated hemoglobin (Hb A1C) (Completed)     Other   ED (erectile dysfunction)   Relevant Medications   sildenafil (VIAGRA) 100 MG tablet   Other Visit Diagnoses     Encounter to establish care with new doctor    -  Primary      Refilled chronic medicines  Diabetes well controlled with just taking Metformin for the last 4 months. Will see how he does without the Lantus Lab Results  Component Value Date   HGBA1C 7.0 (A) 05/25/2022    See back in 4 weeks for annual and fasting labs Defers foot exam today.  No follow-ups on file.   Leeanne Rio, MD

## 2022-06-22 ENCOUNTER — Encounter: Payer: BC Managed Care – PPO | Admitting: Family Medicine

## 2022-08-20 ENCOUNTER — Other Ambulatory Visit: Payer: Self-pay | Admitting: Family Medicine

## 2022-08-20 DIAGNOSIS — E1165 Type 2 diabetes mellitus with hyperglycemia: Secondary | ICD-10-CM

## 2022-08-31 ENCOUNTER — Ambulatory Visit: Payer: BC Managed Care – PPO | Admitting: Family Medicine

## 2022-10-26 ENCOUNTER — Telehealth: Payer: Self-pay | Admitting: Family Medicine

## 2022-10-26 DIAGNOSIS — E1165 Type 2 diabetes mellitus with hyperglycemia: Secondary | ICD-10-CM

## 2022-10-26 MED ORDER — METFORMIN HCL ER 500 MG PO TB24: 1000.0000 mg | ORAL_TABLET | Freq: Two times a day (BID) | ORAL | 3 refills | Status: DC

## 2022-10-26 NOTE — Telephone Encounter (Signed)
Spoke with patient and informed him that his refills were sent to his Pharmacy as requested

## 2022-10-26 NOTE — Telephone Encounter (Signed)
Refills of Metformin sent to [facility]

## 2022-10-26 NOTE — Telephone Encounter (Signed)
Patient calling to request a refill of metformin as a result of recent circumstances. Please advise. Ernest Edwards

## 2022-11-09 ENCOUNTER — Ambulatory Visit: Payer: BC Managed Care – PPO | Admitting: Family Medicine

## 2022-11-10 ENCOUNTER — Encounter: Payer: Self-pay | Admitting: Family Medicine

## 2022-11-10 ENCOUNTER — Ambulatory Visit (INDEPENDENT_AMBULATORY_CARE_PROVIDER_SITE_OTHER): Payer: BC Managed Care – PPO | Admitting: Family Medicine

## 2022-11-10 VITALS — BP 110/72 | HR 79 | Temp 97.9°F | Resp 20 | Ht 74.0 in | Wt 260.2 lb

## 2022-11-10 DIAGNOSIS — N528 Other male erectile dysfunction: Secondary | ICD-10-CM | POA: Diagnosis not present

## 2022-11-10 DIAGNOSIS — E1165 Type 2 diabetes mellitus with hyperglycemia: Secondary | ICD-10-CM | POA: Diagnosis not present

## 2022-11-10 DIAGNOSIS — E1159 Type 2 diabetes mellitus with other circulatory complications: Secondary | ICD-10-CM | POA: Diagnosis not present

## 2022-11-10 DIAGNOSIS — Z794 Long term (current) use of insulin: Secondary | ICD-10-CM

## 2022-11-10 DIAGNOSIS — I10 Essential (primary) hypertension: Secondary | ICD-10-CM | POA: Diagnosis not present

## 2022-11-10 DIAGNOSIS — M25551 Pain in right hip: Secondary | ICD-10-CM

## 2022-11-10 DIAGNOSIS — I152 Hypertension secondary to endocrine disorders: Secondary | ICD-10-CM

## 2022-11-10 MED ORDER — SILDENAFIL CITRATE 100 MG PO TABS: 100.0000 mg | ORAL_TABLET | Freq: Every day | ORAL | 1 refills | Status: AC | PRN

## 2022-11-10 MED ORDER — ATORVASTATIN CALCIUM 80 MG PO TABS: 80.0000 mg | ORAL_TABLET | Freq: Every day | ORAL | 5 refills | Status: AC

## 2022-11-10 MED ORDER — CHLORTHALIDONE 25 MG PO TABS: 25.0000 mg | ORAL_TABLET | Freq: Every day | ORAL | 5 refills | Status: DC

## 2022-11-10 MED ORDER — VALSARTAN 320 MG PO TABS: 320.0000 mg | ORAL_TABLET | Freq: Every day | ORAL | 5 refills | Status: AC

## 2022-11-10 MED ORDER — AMLODIPINE BESYLATE 10 MG PO TABS: 10.0000 mg | ORAL_TABLET | Freq: Every day | ORAL | 5 refills | Status: DC

## 2022-11-10 NOTE — Progress Notes (Signed)
Established Patient Office Visit  Subjective   Patient ID: Ernest Edwards, male    DOB: [birth year]  Age: 62 y.o. MRN: [id number]  Chief Complaint  Patient presents with   Follow-up    Patient is here to discuss his treatment for diabetes he states that he was wanting to know besides the exercising, and diet, if the medication  metformin that he is taking causes any kidney damage?  He also wanted to know what was the best route of treatment using OTC medications for arthritis?    HPI  Diabetes/Hypertension/HLD Pt reports it's been hard for him the last few months due to the passing of his mother in March 2024 from natural causes. She passed at the age of 104. He reports he has concerns with the Metformin 500mg  2 tabs po BID. He's checking his sugars 2-3 times a day. He is exercising almost everyday. He reports it's running 100-150. One day it was above 230 but he knows it's from what he ate.  He is needing refills on his medicine for HTN, HLD and ED medicine (Amlodipine 10mg  daily, Atorvastatin 80mg  daily, Chlorthalidone 25 mg daily, Valsartan 320mg  daily, and Viagra 100mg  daily prn. ) Blood pressure at goal today. Sugar checks at home is stable.   Erectile Dysfunction Using Viagra 100 mg daily prn. Working well wth him.   Right hip pain Sporadic and not every day. Hurts him every now and again. He hasn't tried anything for this. He asks what he can take for this pain. Getting in and out of the car sometimes flares his pain.    Patient Active Problem List   Diagnosis Date Noted   Uncontrolled type 2 diabetes mellitus with hyperglycemia, with long-term current use of insulin (HCC) 06/18/2021   Essential hypertension 06/18/2021   AKI (acute kidney injury) (HCC) 06/18/2021   Dyslipidemia 06/18/2021   Obesity, Class II, BMI 35-39.9 06/18/2021   Persistent proteinuria associated with type 2 diabetes mellitus (HCC) 08/23/2019   ED (erectile dysfunction) 08/21/2019   Deformity of left  foot 08/16/2019   History of amputation of lesser toe, left (HCC) 08/16/2019   Type 2 diabetes mellitus with circulatory disorder, with long-term current use of insulin (HCC) 08/18/2016   Back pain 03/12/2014   Family history of premature CAD 03/12/2014   Hypertension associated with diabetes (HCC) 03/12/2014    Review of Systems  Musculoskeletal:  Positive for joint pain.       Right hip pain  All other systems reviewed and are negative.    Objective:     BP 110/72   Pulse 79   Temp 97.9 F (36.6 C) (Oral)   Resp 20   Ht 6\' 2"  (1.88 m)   Wt 260 lb 3.2 oz (118 kg)   SpO2 99%   BMI 33.41 kg/m    Physical Exam Vitals and nursing note reviewed.  Constitutional:      Appearance: Normal appearance. He is normal weight.  HENT:     Head: Normocephalic and atraumatic.     Right Ear: External ear normal.     Left Ear: External ear normal.     Nose: Nose normal.  Cardiovascular:     Rate and Rhythm: Normal rate and regular rhythm.     Pulses: Normal pulses.     Heart sounds: Normal heart sounds.  Pulmonary:     Effort: Pulmonary effort is normal.     Breath sounds: Normal breath sounds.  Skin:    General: Skin  is warm.     Capillary Refill: Capillary refill takes less than 2 seconds.  Neurological:     General: No focal deficit present.     Mental Status: He is alert and oriented to person, place, and time. Mental status is at baseline.  Psychiatric:        Mood and Affect: Mood normal.        Behavior: Behavior normal.        Thought Content: Thought content normal.        Judgment: Judgment normal.    No results found for any visits on 11/10/22.    The ASCVD Risk score (Arnett DK, et al., 2019) failed to calculate for the following reasons:   Cannot find a previous HDL lab    Assessment & Plan:   Problem List Items Addressed This Visit   None Hypertension associated with diabetes (HCC)  Type 2 diabetes mellitus with other circulatory complication, with  long-term current use of insulin (HCC)  Essential hypertension -     amLODIPine Besylate; Take 1 tablet (10 mg total) by mouth daily.  Dispense: 30 tablet; Refill: 5  Uncontrolled type 2 diabetes mellitus with hyperglycemia, with long-term current use of insulin (HCC) -     Atorvastatin Calcium; Take 1 tablet (80 mg total) by mouth daily.  Dispense: 30 tablet; Refill: 5 -     Chlorthalidone; Take 1 tablet (25 mg total) by mouth daily.  Dispense: 30 tablet; Refill: 5 -     Valsartan; Take 1 tablet (320 mg total) by mouth daily.  Dispense: 30 tablet; Refill: 5  Other male erectile dysfunction -     Sildenafil Citrate; Take 1 tablet (100 mg total) by mouth daily as needed for erectile dysfunction.  Dispense: 10 tablet; Refill: 1  Right hip pain   Refilled chronic medicines for HTN and HLD along with ED Advised can take OTC Aleve or Tylenol prn for hip pain. To return soon for CPE/labs.  No follow-ups on file.    Suzan Slick, MD

## 2022-11-14 IMAGING — DX DG FOOT COMPLETE 3+V*L*
3 series · 3 of 3 positions shown · non-contrast
Comparison: 06/17/2021

CLINICAL DATA: Status post surgery.

EXAM:
LEFT FOOT - COMPLETE 3+ VIEW

[foot obl]
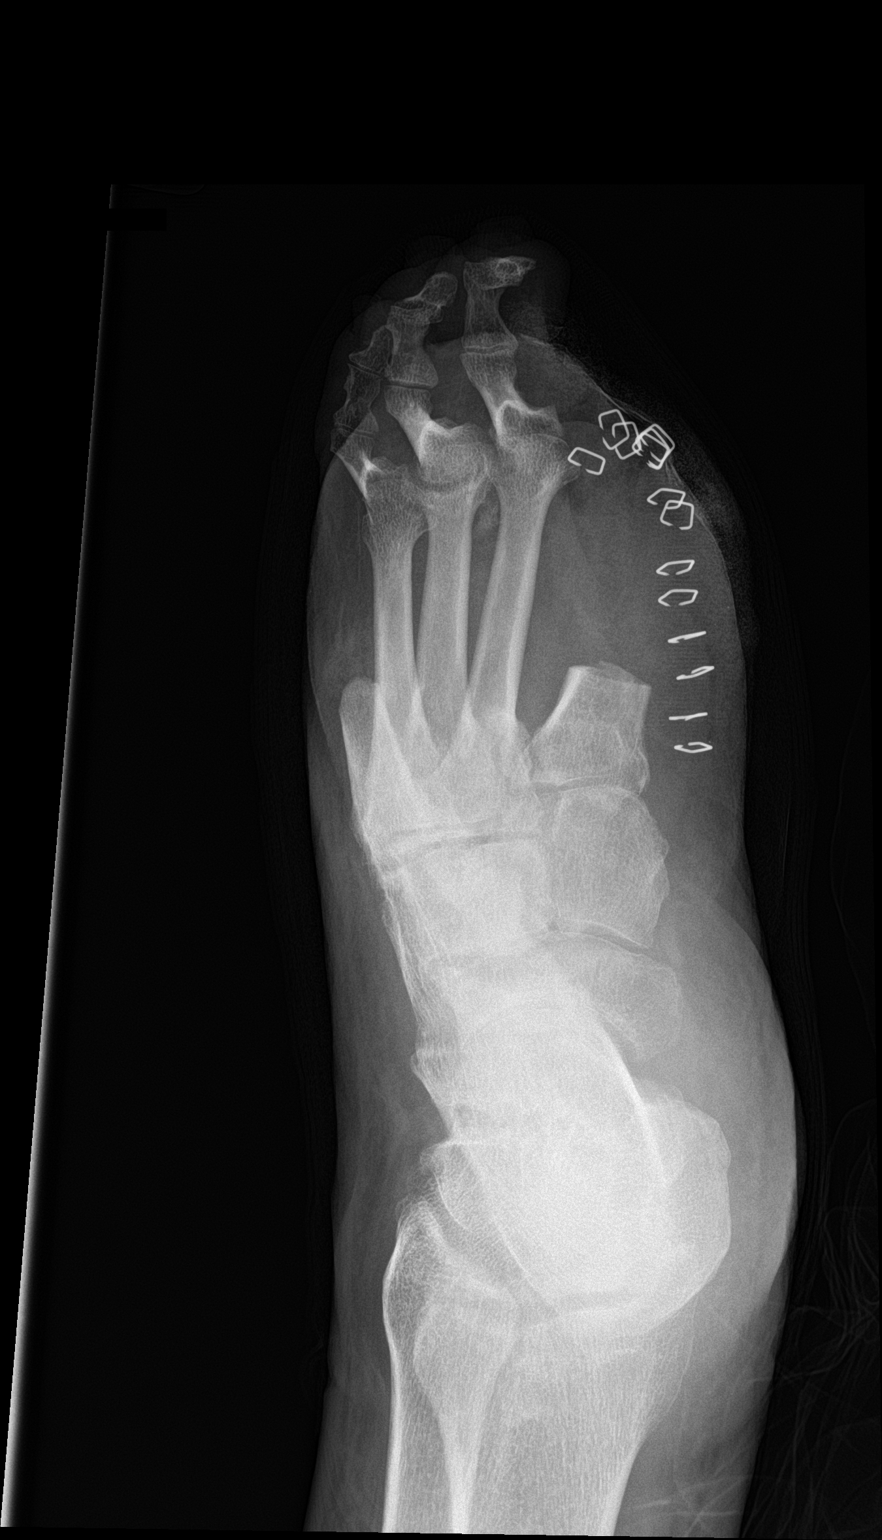

[foot ap]
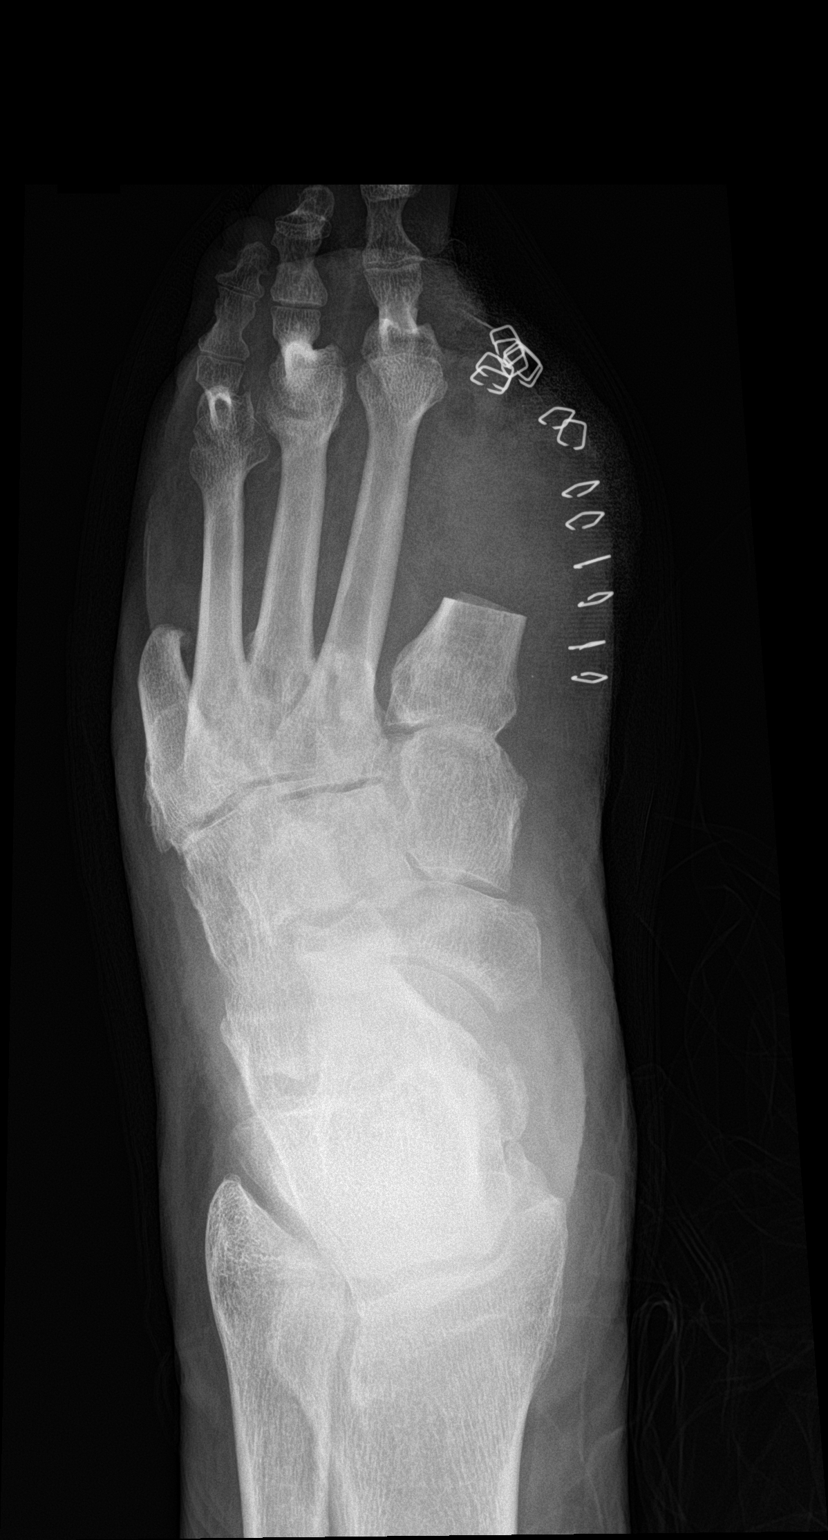

[foot lat]
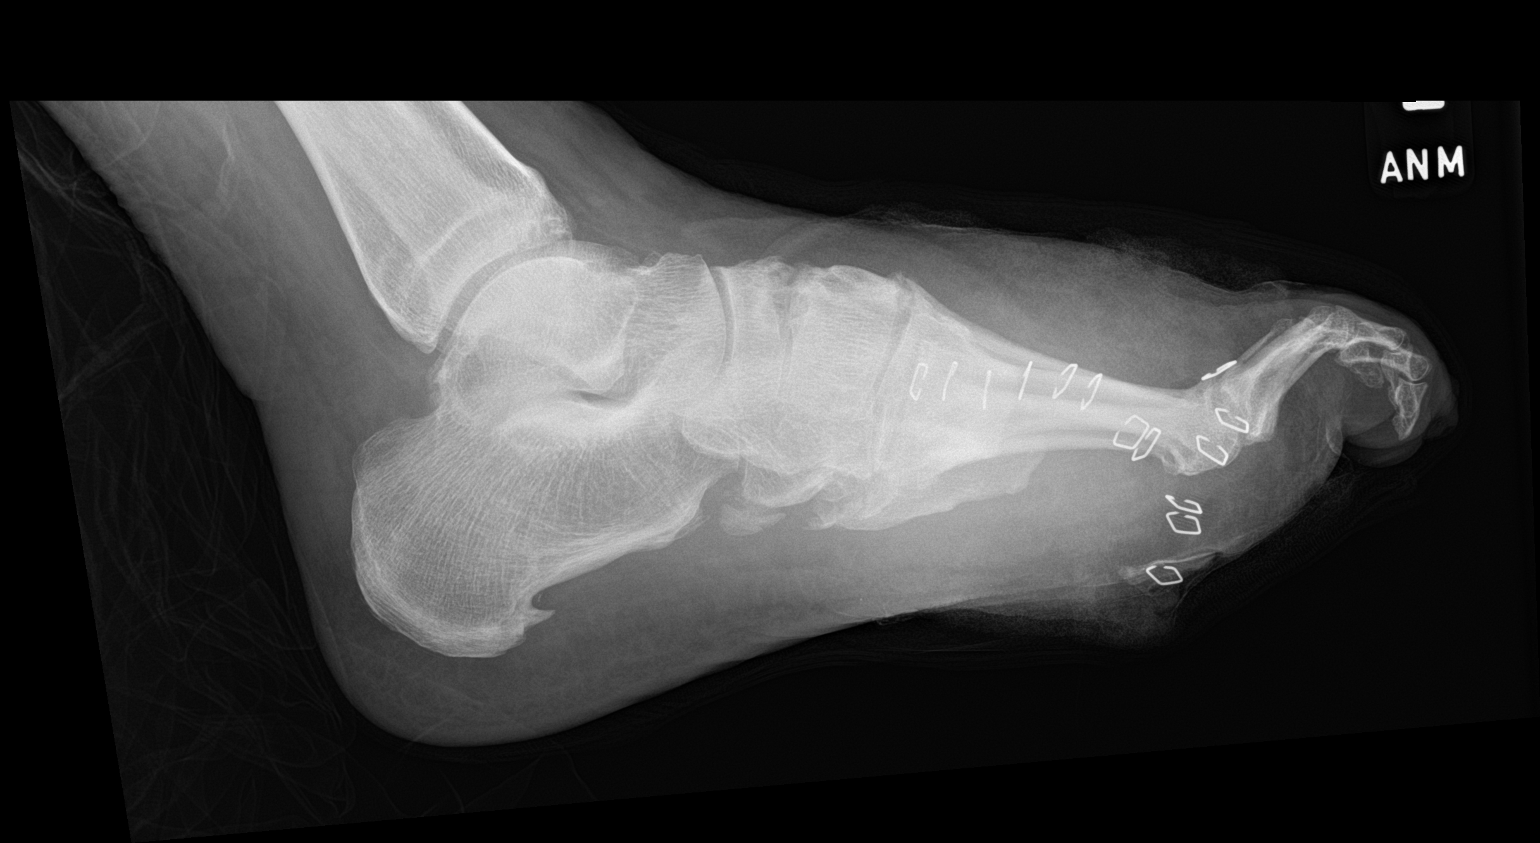

[3 of 3 positions shown; findings below may reference images not displayed]

FINDINGS: Interval transmetatarsal amputation of the first metatarsal through
the proximal shaft. Prior transmetatarsal amputation of the fifth
metatarsal through the mid diaphysis. Surgical staples along the
medial aspect of the forefoot. No periosteal reaction or bone
destruction. No acute fracture or dislocation. Small plantar
calcaneal spur. Degenerative changes of the navicular-cuneiform
joints.
IMPRESSION: 1. Interval transmetatarsal amputation of the first metatarsal
through the proximal shaft.
2. Prior transmetatarsal amputation of the fifth metatarsal through
the mid diaphysis. Surgical staples along the medial aspect of the
forefoot.

## 2023-03-23 ENCOUNTER — Other Ambulatory Visit: Payer: Self-pay | Admitting: Family Medicine

## 2023-03-23 DIAGNOSIS — E1165 Type 2 diabetes mellitus with hyperglycemia: Secondary | ICD-10-CM

## 2023-04-24 ENCOUNTER — Other Ambulatory Visit: Payer: Self-pay | Admitting: Family Medicine

## 2023-04-24 DIAGNOSIS — E1165 Type 2 diabetes mellitus with hyperglycemia: Secondary | ICD-10-CM

## 2023-04-26 ENCOUNTER — Ambulatory Visit: Payer: BC Managed Care – PPO | Admitting: Family Medicine

## 2023-05-03 ENCOUNTER — Ambulatory Visit: Payer: BC Managed Care – PPO | Admitting: Family Medicine

## 2023-05-17 ENCOUNTER — Encounter: Payer: Self-pay | Admitting: Family Medicine

## 2023-05-17 ENCOUNTER — Ambulatory Visit (INDEPENDENT_AMBULATORY_CARE_PROVIDER_SITE_OTHER): Payer: BC Managed Care – PPO | Admitting: Family Medicine

## 2023-05-17 VITALS — BP 147/87 | HR 83 | Temp 97.8°F | Resp 18 | Ht 74.0 in | Wt 261.9 lb

## 2023-05-17 DIAGNOSIS — Z794 Long term (current) use of insulin: Secondary | ICD-10-CM | POA: Diagnosis not present

## 2023-05-17 DIAGNOSIS — E1159 Type 2 diabetes mellitus with other circulatory complications: Secondary | ICD-10-CM | POA: Diagnosis not present

## 2023-05-17 NOTE — Progress Notes (Signed)
   Established Patient Office Visit  Subjective   Patient ID: Ernest Edwards, male    DOB: 04/06/1961  Age: 63 y.o. MRN: [id number]  Chief Complaint  Patient presents with   Medical Management of Chronic Issues    Patient is here for a follow up for medication and DM    HPI  Diabetes Pt is here for follow up of diabetes. He has had 2 no shows and 2 no show cancellations in January. He made appt for medication and diabetes follow up. Last kidney labs was done in 2023 and last A1c in Feb 2024. Today I advised pt that blood work will need to be done due to lost to follow up to monitor kidney function etc. Pt reports he wasn't 'prepared to give blood work today'. I advised him that in order to continue getting refills on the medicines, he will need to have labs done. I asked pt if tomorrow would be a better day to return for labs. Pt then says it's not good because I have to be at work tomorrow but I can come back on Wednesday. I told pt that's fine and asked him what medicines he would need refilling. That I could refill until he comes back on Wednesday. I told him that it wouldn't be good practice for me to refill medicines that could potentially be affecting his kidneys. Pt voiced understanding and then stated that he thinks it's better practice for his provider to talk about ways to get off of medicines except for just throwing medicines for them to take.   I then advised pt I couldn't have this conversation with pt because he no showed 2 appts for follow up and then had 2 no/show cancellations this month.  It was then agreed upon mutually that the provider/patient relationship was compromised and pt will seek another provider for care.    ROS    Objective:     BP (!) 147/87   Pulse 83   Temp 97.8 F (36.6 C) (Oral)   Resp 18   Ht 6\' 2"  (1.88 m)   Wt 261 lb 14.4 oz (118.8 kg)   SpO2 99%   BMI 33.63 kg/m    Physical Exam   No results found for any visits on  05/17/23.    The ASCVD Risk score (Arnett DK, et al., 2019) failed to calculate for the following reasons:   Cannot find a previous HDL lab    Assessment & Plan:   Problem List Items Addressed This Visit       Cardiovascular and Mediastinum   Type 2 diabetes mellitus with circulatory disorder, with long-term current use of insulin (HCC) - Primary   Mutual agreement that patient provider relationship will be terminated. No follow-ups on file.    Suzan Slick, MD

## 2023-05-25 ENCOUNTER — Other Ambulatory Visit: Payer: Self-pay | Admitting: Family Medicine

## 2023-05-25 DIAGNOSIS — E1165 Type 2 diabetes mellitus with hyperglycemia: Secondary | ICD-10-CM

## 2023-05-25 NOTE — Telephone Encounter (Signed)
 Copied from CRM [id number]. Topic: Clinical - Medication Refill >> May 25, 2023  4:39 PM Delon DASEN wrote: Most Recent Primary Care Visit:  Provider: COLETTE TORRENCE GRADE  Department: [facility]  Visit Type: OFFICE VISIT  Date: 05/17/2023  Medication: metFORMIN  (GLUCOPHAGE -XR) 500 MG 24 hr tablet  Has the patient contacted their pharmacy? No (Agent: If no, request that the patient contact the pharmacy for the refill. If patient does not wish to contact the pharmacy document the reason why and proceed with request.) (Agent: If yes, when and what did the pharmacy advise?)  Is this the correct pharmacy for this prescription? Yes If no, delete pharmacy and type the correct one.  This is the patient's preferred pharmacy:  [facility] [address], [place] - [address] [place] [address] Phone: [phone] Fax: [phone]   Has the prescription been filled recently? Yes  Is the patient out of the medication? No  Has the patient been seen for an appointment in the last year OR does the patient have an upcoming appointment? Yes  Can we respond through MyChart? Yes  Agent: Please be advised that Rx refills may take up to 3 business days. We ask that you follow-up with your pharmacy.

## 2023-05-25 NOTE — Telephone Encounter (Unsigned)
 Copied from CRM [id number]. Topic: Clinical - Medication Refill >> May 25, 2023  4:34 PM Delon DASEN wrote: Most Recent Primary Care Visit:  Provider: COLETTE TORRENCE GRADE  Department: [facility]  Visit Type: OFFICE VISIT  Date: 05/17/2023  Medication: metFORMIN  (GLUCOPHAGE -XR) 500 MG 24 hr tablet  Has the patient contacted their pharmacy? No (Agent: If no, request that the patient contact the pharmacy for the refill. If patient does not wish to contact the pharmacy document the reason why and proceed with request.) (Agent: If yes, when and what did the pharmacy advise?)  Is this the correct pharmacy for this prescription? Yes If no, delete pharmacy and type the correct one.  This is the patient's preferred pharmacy:  [facility] [address] [place] - [address]  [place]  [address] Phone: [phone] Fax: [phone]   Has the prescription been filled recently? Yes  Is the patient out of the medication? No  Has the patient been seen for an appointment in the last year OR does the patient have an upcoming appointment? Yes  Can we respond through MyChart? Yes  Agent: Please be advised that Rx refills may take up to 3 business days. We ask that you follow-up with your pharmacy.

## 2023-05-25 NOTE — Telephone Encounter (Signed)
 Last Fill: 04/24/23  Last OV: 05/17/23 Next OV: None Scheduled  Routing to provider for review/authorization.

## 2023-05-26 MED ORDER — METFORMIN HCL ER 500 MG PO TB24: 1000.0000 mg | ORAL_TABLET | Freq: Two times a day (BID) | ORAL | 0 refills | Status: DC

## 2023-06-16 ENCOUNTER — Other Ambulatory Visit: Payer: Self-pay | Admitting: Family Medicine

## 2023-06-16 DIAGNOSIS — E1165 Type 2 diabetes mellitus with hyperglycemia: Secondary | ICD-10-CM

## 2023-06-19 ENCOUNTER — Other Ambulatory Visit: Payer: Self-pay | Admitting: Family Medicine

## 2023-06-19 DIAGNOSIS — E1165 Type 2 diabetes mellitus with hyperglycemia: Secondary | ICD-10-CM

## 2023-06-24 ENCOUNTER — Other Ambulatory Visit: Payer: Self-pay | Admitting: Family Medicine

## 2023-06-24 DIAGNOSIS — E1165 Type 2 diabetes mellitus with hyperglycemia: Secondary | ICD-10-CM

## 2023-07-18 ENCOUNTER — Other Ambulatory Visit: Payer: Self-pay | Admitting: Family Medicine

## 2023-07-18 DIAGNOSIS — E1165 Type 2 diabetes mellitus with hyperglycemia: Secondary | ICD-10-CM

## 2023-10-11 ENCOUNTER — Ambulatory Visit (INDEPENDENT_AMBULATORY_CARE_PROVIDER_SITE_OTHER): Admitting: Podiatry

## 2023-10-11 DIAGNOSIS — L97512 Non-pressure chronic ulcer of other part of right foot with fat layer exposed: Secondary | ICD-10-CM

## 2023-10-11 DIAGNOSIS — Q666 Other congenital valgus deformities of feet: Secondary | ICD-10-CM

## 2023-10-11 NOTE — Progress Notes (Signed)
 Subjective:  Patient ID: Ernest Edwards, male    DOB: 01-Apr-1961,  MRN: [id number]  Chief Complaint  Patient presents with   Callouses    Right foot  Pt stated that he noticed this place on the bottom of his right foot he stated that he has been cleaning it and keeping it bandaged     63 y.o. male presents for wound care.  Patient presents with a new complaint of right submetatarsal 1 ulceration with fat layer exposed.  Patient states that it just came out of nowhere started happening about 2 to 3 weeks ago.  He states that he was going to go to urgent care but decided just come here directly.  Denies any other acute complaints.  He has been doing some daily dressing changes.  Has been working on his feet.   Review of Systems: Negative except as noted in the HPI. Denies N/V/F/Ch.  Past Medical History:  Diagnosis Date   Diabetes mellitus without complication (HCC)    Hypertension     Current Outpatient Medications:    amLODipine  (NORVASC ) 10 MG tablet, Take 1 tablet (10 mg total) by mouth daily., Disp: 30 tablet, Rfl: 5   atorvastatin  (LIPITOR ) 80 MG tablet, Take 1 tablet (80 mg total) by mouth daily., Disp: 30 tablet, Rfl: 5   chlorthalidone  (HYGROTON ) 25 MG tablet, Take 1 tablet (25 mg total) by mouth daily., Disp: 30 tablet, Rfl: 5   Glucose Blood (BLOOD GLUCOSE TEST STRIPS) STRP, Check blood sugars 2 times daily.  Diagnosis E11.9 patient on insulin ., Disp: 100 strip, Rfl: 3   metFORMIN  (GLUCOPHAGE -XR) 500 MG 24 hr tablet, Take 2 tablets (1,000 mg total) by mouth 2 (two) times daily with a meal., Disp: 120 tablet, Rfl: 0   OVER THE COUNTER MEDICATION, Take 1 capsule by mouth daily. CLA- body fat reduction, Disp: , Rfl:    sildenafil  (VIAGRA ) 100 MG tablet, Take 1 tablet (100 mg total) by mouth daily as needed for erectile dysfunction., Disp: 10 tablet, Rfl: 1   valsartan  (DIOVAN ) 320 MG tablet, Take 1 tablet (320 mg total) by mouth daily., Disp: 30 tablet, Rfl: 5  Social History    Tobacco Use  Smoking Status Never  Smokeless Tobacco Never    No Known Allergies Objective:  There were no vitals filed for this visit. There is no height or weight on file to calculate BMI. Constitutional Well developed. Well nourished.  Vascular Dorsalis pedis pulses palpable bilaterally. Posterior tibial pulses palpable bilaterally. Capillary refill normal to all digits.  No cyanosis or clubbing noted. Pedal hair growth normal.  Neurologic Normal speech. Oriented to person, place, and time. Protective sensation absent  Dermatologic Wound Location: Right submet 1 ulceration with fat layer exposed.  No purulent drainage noted.  Macerated noted.  No probing down to deep tissue noted.  No malodor present Wound Base: Mixed Granular/Fibrotic Peri-wound: Calloused Exudate: Scant/small amount Serosanguinous exudate Wound Measurements: - See above  Orthopedic: No pain to palpation either foot.   Radiographs: None Assessment:   1. Right foot ulcer, with fat layer exposed (HCC)    Plan:  Patient was evaluated and treated and all questions answered.  Ulcer right 1 ulceration with fat layer exposed Betadine -Debridement as below. -Dressed with pain Betadine wet-to-dry, DSD. -Continue off-loading with cam boot - Patient is a high risk of undergoing amputation given his nature of diabetes.  Discussed glucose control and shoe gear modification  Procedure: Excisional Debridement of Wound Tool: Sharp chisel blade/tissue nipper  Rationale: Removal of non-viable soft tissue from the wound to promote healing.  Anesthesia: none Pre-Debridement Wound Measurements: 1.5 cm x 1.5 cm x 0.3 cm  Post-Debridement Wound Measurements: 1.7 cm x 1.7 cm x 0.3 cm  Type of Debridement: Sharp Excisional Tissue Removed: Non-viable soft tissue Blood loss: Minimal (<50cc) Depth of Debridement: subcutaneous tissue. Technique: Sharp excisional debridement to bleeding, viable wound base.  Wound  Progress: This is my initial evaluation of continue monitor the progression of the wound Site healing conversation 7 Dressing: Dry, sterile, compression dressing. Disposition: Patient tolerated procedure well. Patient to return in 1 week for follow-up.  Pes planovalgus -I explained to patient the etiology of pes planovalgus and relationship with Planter fasciitis and various treatment options were discussed.  Given patient foot structure in the setting of Planter fasciitis I believe patient will benefit from custom-made orthotics to help control the hindfoot motion support the arch of the foot and take the stress away from plantar fascial.  Patient agrees with the plan like to proceed with orthotics -Patient was casted for orthotics   No follow-ups on file.

## 2023-10-18 ENCOUNTER — Ambulatory Visit
Admission: EM | Admit: 2023-10-18 | Discharge: 2023-10-18 | Disposition: A | Discharge status: Home / Self Care | Attending: Family Medicine | Admitting: Family Medicine

## 2023-10-18 ENCOUNTER — Other Ambulatory Visit: Payer: Self-pay

## 2023-10-18 DIAGNOSIS — I10 Essential (primary) hypertension: Secondary | ICD-10-CM | POA: Diagnosis not present

## 2023-10-18 DIAGNOSIS — E119 Type 2 diabetes mellitus without complications: Secondary | ICD-10-CM | POA: Diagnosis not present

## 2023-10-18 DIAGNOSIS — E785 Hyperlipidemia, unspecified: Secondary | ICD-10-CM

## 2023-10-18 LAB — POCT FASTING CBG KUC MANUAL ENTRY: POCT Glucose (KUC): 116 mg/dL — AB (ref 70–99)

## 2023-10-18 MED ORDER — AMLODIPINE BESYLATE 10 MG PO TABS: 10.0000 mg | ORAL_TABLET | Freq: Every day | ORAL | 0 refills | Status: DC

## 2023-10-18 NOTE — Discharge Instructions (Addendum)
 Your amlodipine  has been refilled for 30 days.  Please establish with a primary care provider ASAP for further refills as well as overall management of your health.

## 2023-10-18 NOTE — ED Triage Notes (Signed)
 Pt states was dropped by PCP for not showing up to appts and has been out of diabetic and bp meds for a month. Pt wants bp and blood glucose checked. Pt denies sx at this time.

## 2023-10-18 NOTE — ED Provider Notes (Signed)
 [facility]    CSN: [id number] Arrival date & time: 10/18/23  1357      History   Chief Complaint No chief complaint on file.   HPI Ernest Edwards is a 63 y.o. male with a history of diabetes, hypertension, hyperlipidemia who presents for medication refills.  Patient has been dismissed from his primary care practice due to lack of follow-up.  He states he has been out of his medications for 1 month.  He does not currently have PCP but states he is currently looking for one.  He denies any headache, chest pain, shortness of breath, dizziness, visual changes.  No other concerns at this time.  HPI  Past Medical History:  Diagnosis Date   Diabetes mellitus without complication (HCC)    Hypertension     Patient Active Problem List   Diagnosis Date Noted   Uncontrolled type 2 diabetes mellitus with hyperglycemia, with long-term current use of insulin  (HCC) 06/18/2021   Essential hypertension 06/18/2021   AKI (acute kidney injury) (HCC) 06/18/2021   Dyslipidemia 06/18/2021   Obesity, Class II, BMI 35-39.9 06/18/2021   Persistent proteinuria associated with type 2 diabetes mellitus (HCC) 08/23/2019   ED (erectile dysfunction) 08/21/2019   Deformity of left foot 08/16/2019   History of amputation of lesser toe, left (HCC) 08/16/2019   Type 2 diabetes mellitus with circulatory disorder, with long-term current use of insulin  (HCC) 08/18/2016   Back pain 03/12/2014   Family history of premature CAD 03/12/2014   Hypertension associated with diabetes (HCC) 03/12/2014    Past Surgical History:  Procedure Laterality Date   AMPUTATION Left 06/20/2021   Procedure: Partial First Ray Amputation, left foot;  Surgeon: Tobie Franky SQUIBB, DPM;  Location: [facility] OR;  Service: Podiatry;  Laterality: Left;       Home Medications    Prior to Admission medications   Medication Sig Start Date End Date Taking? Authorizing Provider  amLODipine  (NORVASC ) 10 MG tablet Take 1 tablet (10 mg  total) by mouth daily. 10/18/23   Avalynn Bowe, Jodi R, NP  atorvastatin  (LIPITOR ) 80 MG tablet Take 1 tablet (80 mg total) by mouth daily. 11/10/22   Colette Torrence GRADE, MD  chlorthalidone  (HYGROTON ) 25 MG tablet Take 1 tablet (25 mg total) by mouth daily. 11/10/22   Colette Torrence GRADE, MD  Glucose Blood (BLOOD GLUCOSE TEST STRIPS) STRP Check blood sugars 2 times daily.  Diagnosis E11.9 patient on insulin . 05/25/22   Colette Torrence GRADE, MD  metFORMIN  (GLUCOPHAGE -XR) 500 MG 24 hr tablet Take 2 tablets (1,000 mg total) by mouth 2 (two) times daily with a meal. 05/26/23   Rucker, Torrence GRADE, MD  OVER THE COUNTER MEDICATION Take 1 capsule by mouth daily. CLA- body fat reduction    [provider]  sildenafil  (VIAGRA ) 100 MG tablet Take 1 tablet (100 mg total) by mouth daily as needed for erectile dysfunction. 11/10/22   Colette Torrence GRADE, MD  valsartan  (DIOVAN ) 320 MG tablet Take 1 tablet (320 mg total) by mouth daily. 11/10/22   Colette Torrence GRADE, MD  hydrochlorothiazide  (MICROZIDE ) 12.5 MG capsule Take 1 capsule (12.5 mg total) by mouth daily. 02/21/14 05/20/19  Nasario Moats, PA-C  lisinopril  (ZESTRIL ) 10 MG tablet Take 1 tablet (10 mg total) by mouth daily. 05/20/19 05/20/19  Adah Wilbert LABOR, FNP    Family History Family History  Problem Relation Age of Onset   Hypertension Mother    Diabetes Mother    Diabetes Father    Hypertension Father  Heart attack Sister    Heart attack Brother     Social History Social History   Tobacco Use   Smoking status: Never   Smokeless tobacco: Never  Vaping Use   Vaping status: Never Used  Substance Use Topics   Alcohol use: No   Drug use: No     Allergies   Patient has no known allergies.   Review of Systems Review of Systems  Constitutional:        Medication refills     Physical Exam Triage Vital Signs ED Triage Vitals  Encounter Vitals Group     BP 10/18/23 1440 (!) 177/100     Girls Systolic BP Percentile --      Girls Diastolic BP  Percentile --      Boys Systolic BP Percentile --      Boys Diastolic BP Percentile --      Pulse Rate 10/18/23 1440 70     Resp 10/18/23 1440 17     Temp 10/18/23 1440 98.1 F (36.7 C)     Temp Source 10/18/23 1440 Oral     SpO2 10/18/23 1440 97 %     Weight --      Height --      Head Circumference --      Peak Flow --      Pain Score 10/18/23 1439 0     Pain Loc --      Pain Education --      Exclude from Growth Chart --    No data found.  Updated Vital Signs BP (!) 177/100   Pulse 70   Temp 98.1 F (36.7 C) (Oral)   Resp 17   SpO2 97%   Visual Acuity Right Eye Distance:   Left Eye Distance:   Bilateral Distance:    Right Eye Near:   Left Eye Near:    Bilateral Near:     Physical Exam Vitals and nursing note reviewed.  Constitutional:      General: He is not in acute distress.    Appearance: Normal appearance. He is not ill-appearing.  HENT:     Head: Normocephalic and atraumatic.  Eyes:     Pupils: Pupils are equal, round, and reactive to light.  Cardiovascular:     Rate and Rhythm: Normal rate.  Pulmonary:     Effort: Pulmonary effort is normal.  Skin:    General: Skin is warm and dry.  Neurological:     General: No focal deficit present.     Mental Status: He is alert and oriented to person, place, and time.  Psychiatric:        Mood and Affect: Mood normal.        Behavior: Behavior normal.      UC Treatments / Results  Labs (all labs ordered are listed, but only abnormal results are displayed) Labs Reviewed  POCT FASTING CBG [facility] MANUAL ENTRY - Abnormal; Notable for the following components:      Result Value   POCT Glucose ([facility]) 116 (*)    All other components within normal limits    EKG   Radiology No results found.  Procedures Procedures (including critical care time)  Medications Ordered in UC Medications - No data to display  Initial Impression / Assessment and Plan / UC Course  I have reviewed the triage vital signs and  the nursing notes.  Pertinent labs & imaging results that were available during my care of the patient were reviewed by  me and considered in my medical decision making (see chart for details).     Patient presenting for medication refills.  Chart review shows no blood work over the past 2 years indicating his liver function, kidney function, or electrolytes.  Discussed with patient that we will need to draw a CMP to check these due to his atorvastatin , chlorthalidone , metformin , valsartan  can be prescribed.  Patient declined blood work and was upset that it was needed.  Informed him that I would only be able to refill his amlodipine  due to this.  Patient verbalized understanding and 30-day refill of amlodipine  sent to pharmacy.  Advised patient any additional refills will need to go through a PCP. Nursing staff unable to schedule PCP appt online, pt given number to call to set up new PCP appointment. Final Clinical Impressions(s) / UC Diagnoses   Final diagnoses:  Hypertension, unspecified type  Hyperlipidemia, unspecified hyperlipidemia type  Type 2 diabetes mellitus without complication, unspecified whether long term insulin  use [facility])     Discharge Instructions      Your amlodipine  has been refilled for 30 days.  Please establish with a primary care provider ASAP for further refills as well as overall management of your health.     ED Prescriptions     Medication Sig Dispense Auth. Provider   amLODipine  (NORVASC ) 10 MG tablet Take 1 tablet (10 mg total) by mouth daily. 30 tablet Jameshia Hayashida, Jodi R, NP      PDMP not reviewed this encounter.   Loreda Myla SAUNDERS, NP 10/18/23 [phone]

## 2023-10-26 NOTE — Progress Notes (Signed)
 Orthotic order placed will schedule for fitting when in offload to Right first MT head requested

## 2023-11-08 ENCOUNTER — Ambulatory Visit (INDEPENDENT_AMBULATORY_CARE_PROVIDER_SITE_OTHER): Admitting: Podiatry

## 2023-11-08 ENCOUNTER — Encounter: Admitting: Student

## 2023-11-08 DIAGNOSIS — L97512 Non-pressure chronic ulcer of other part of right foot with fat layer exposed: Secondary | ICD-10-CM

## 2023-11-08 NOTE — Progress Notes (Signed)
 Subjective:  Patient ID: Ernest Edwards, male    DOB: May 02, 1960,  MRN: [id number]  Chief Complaint  Patient presents with   Wound Check    Right foot ulcer follow up pt stated that he is doing okay     63 y.o. male presents for wound care.  Patient presents with follow-up of right submetatarsal 1 ulceration he states is doing a little bit better has been doing iodine dressings denies any other acute complaints.   Review of Systems: Negative except as noted in the HPI. Denies N/V/F/Ch.  Past Medical History:  Diagnosis Date   Diabetes mellitus without complication (HCC)    Hypertension     Current Outpatient Medications:    amLODipine  (NORVASC ) 10 MG tablet, Take 1 tablet (10 mg total) by mouth daily., Disp: 30 tablet, Rfl: 0   atorvastatin  (LIPITOR ) 80 MG tablet, Take 1 tablet (80 mg total) by mouth daily., Disp: 30 tablet, Rfl: 5   chlorthalidone  (HYGROTON ) 25 MG tablet, Take 1 tablet (25 mg total) by mouth daily., Disp: 30 tablet, Rfl: 5   Glucose Blood (BLOOD GLUCOSE TEST STRIPS) STRP, Check blood sugars 2 times daily.  Diagnosis E11.9 patient on insulin ., Disp: 100 strip, Rfl: 3   metFORMIN  (GLUCOPHAGE -XR) 500 MG 24 hr tablet, Take 2 tablets (1,000 mg total) by mouth 2 (two) times daily with a meal., Disp: 120 tablet, Rfl: 0   OVER THE COUNTER MEDICATION, Take 1 capsule by mouth daily. CLA- body fat reduction, Disp: , Rfl:    sildenafil  (VIAGRA ) 100 MG tablet, Take 1 tablet (100 mg total) by mouth daily as needed for erectile dysfunction., Disp: 10 tablet, Rfl: 1   valsartan  (DIOVAN ) 320 MG tablet, Take 1 tablet (320 mg total) by mouth daily., Disp: 30 tablet, Rfl: 5  Social History   Tobacco Use  Smoking Status Never  Smokeless Tobacco Never    No Known Allergies Objective:  There were no vitals filed for this visit. There is no height or weight on file to calculate BMI. Constitutional Well developed. Well nourished.  Vascular Dorsalis pedis pulses palpable  bilaterally. Posterior tibial pulses palpable bilaterally. Capillary refill normal to all digits.  No cyanosis or clubbing noted. Pedal hair growth normal.  Neurologic Normal speech. Oriented to person, place, and time. Protective sensation absent  Dermatologic Wound Location: Right submet 1 ulceration with fat layer exposed.  No purulent drainage noted.  Macerated noted.  No probing down to deep tissue noted.  No malodor present Wound Base: Mixed Granular/Fibrotic Peri-wound: Calloused Exudate: Scant/small amount Serosanguinous exudate Wound Measurements: - See above  Orthopedic: No pain to palpation either foot.   Radiographs: None Assessment:   1. Right foot ulcer, with fat layer exposed (HCC)     Plan:  Patient was evaluated and treated and all questions answered.  Ulcer right 1 ulceration with fat layer exposed Betadine -Debridement as below. -Dressed with pain Betadine wet-to-dry, DSD. -Continue off-loading with cam boot - Patient is a high risk of undergoing amputation given his nature of diabetes.  Discussed glucose control and shoe gear modification  Procedure: Excisional Debridement of Wound~improving Tool: Sharp chisel blade/tissue nipper Rationale: Removal of non-viable soft tissue from the wound to promote healing.  Anesthesia: none Pre-Debridement Wound Measurements: 0.5 cm x 0.5 cm x 0.3 cm  Post-Debridement Wound Measurements: 0.6 cm x 0.6 cm x 0.3 cm  Type of Debridement: Sharp Excisional Tissue Removed: Non-viable soft tissue Blood loss: Minimal (<50cc) Depth of Debridement: subcutaneous tissue. Technique: Sharp excisional  debridement to bleeding, viable wound base.  Wound Progress: This is my initial evaluation of continue monitor the progression of the wound Site healing conversation 7 Dressing: Dry, sterile, compression dressing. Disposition: Patient tolerated procedure well. Patient to return in 1 week for follow-up.  Pes planovalgus -I explained  to patient the etiology of pes planovalgus and relationship with Planter fasciitis and various treatment options were discussed.  Given patient foot structure in the setting of Planter fasciitis I believe patient will benefit from custom-made orthotics to help control the hindfoot motion support the arch of the foot and take the stress away from plantar fascial.  Patient agrees with the plan like to proceed with orthotics -Patient was casted for orthotics   No follow-ups on file.

## 2023-11-14 ENCOUNTER — Encounter: Payer: Self-pay | Admitting: Student

## 2023-11-14 NOTE — Patient Instructions (Signed)

## 2023-11-14 NOTE — Progress Notes (Unsigned)
 No chief complaint on file.      New Patient Visit SUBJECTIVE: HPI: Ernest Edwards is an 63 y.o.male who is being seen for establishing care.  PMHx - DM, HTN PSX-  The patient was previously seen at ***.  Employment: SH: Married***   Children**** Education:  Followed by podiatry  HTN Amlodipine  10 mg daily, valsartan  (Diovan ) 320 mg daily  HLD Atorvastatin  (Lipitor ) 80 mg daily  DM Metformin  1000 mg twice daily  ED Sildenafil  100 mg daily as needed  Ophthalmology?     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, left foot;  Surgeon: Tobie Franky SQUIBB, DPM;  Location: [facility] OR;  Service: Podiatry;  Laterality: Left;   Family History  Problem Relation Age of Onset   Hypertension Mother    Diabetes Mother    Diabetes Father    Hypertension Father    Heart attack Sister    Heart attack Brother    No Known Allergies  Current Outpatient Medications:    amLODipine  (NORVASC ) 10 MG tablet, Take 1 tablet (10 mg total) by mouth daily., Disp: 30 tablet, Rfl: 0   atorvastatin  (LIPITOR ) 80 MG tablet, Take 1 tablet (80 mg total) by mouth daily., Disp: 30 tablet, Rfl: 5   chlorthalidone  (HYGROTON ) 25 MG tablet, Take 1 tablet (25 mg total) by mouth daily., Disp: 30 tablet, Rfl: 5   Glucose Blood (BLOOD GLUCOSE TEST STRIPS) STRP, Check blood sugars 2 times daily.  Diagnosis E11.9 patient on insulin ., Disp: 100 strip, Rfl: 3   metFORMIN  (GLUCOPHAGE -XR) 500 MG 24 hr tablet, Take 2 tablets (1,000 mg total) by mouth 2 (two) times daily with a meal., Disp: 120 tablet, Rfl: 0   OVER THE COUNTER MEDICATION, Take 1 capsule by mouth daily. CLA- body fat reduction, Disp: , Rfl:    sildenafil  (VIAGRA ) 100 MG tablet, Take 1 tablet (100 mg total) by mouth daily as needed for erectile dysfunction., Disp: 10  tablet, Rfl: 1   valsartan  (DIOVAN ) 320 MG tablet, Take 1 tablet (320 mg total) by mouth daily., Disp: 30 tablet, Rfl: 5  PHQ9 Today:    05/25/2022    8:49 AM  Depression screen PHQ 2/9  Decreased Interest 0  Down, Depressed, Hopeless 0  PHQ - 2 Score 0  Altered sleeping 0  Tired, decreased energy 0  Change in appetite 0  Feeling bad or failure about yourself  0  Trouble concentrating 0  Moving slowly or fidgety/restless 0  Suicidal thoughts 0  PHQ-9 Score 0  Difficult doing work/chores Not difficult at all   GAD7 Today:    05/25/2022    8:49 AM  GAD 7 : Generalized Anxiety Score  Nervous, Anxious, on Edge 0  Control/stop worrying 0  Worry too much - different things 0  Trouble relaxing 0  Restless 0  Easily annoyed or irritable 0  Afraid - awful might happen 0  Total GAD 7 Score 0  Anxiety Difficulty Not difficult at all    OBJECTIVE: There were no vitals taken for this visit. General:  well developed, well nourished, in no apparent distress Skin:  no significant moles, warts, or growths Nose:  nares patent, septum midline, mucosa normal Throat/Pharynx:  lips and gingiva without lesion; tongue and uvula midline; non-inflamed pharynx; no exudates or postnasal drainage Lungs:  clear  to auscultation, breath sounds equal bilaterally, no respiratory distress Cardio:  regular rate and rhythm, no LE edema or bruits Musculoskeletal:  symmetrical muscle groups noted without atrophy or deformity Neuro:  gait normal Psych: well oriented with normal range of affect and appropriate judgment/insight  ASSESSMENT/PLAN: No diagnosis found.  Patient instructed to sign release of records form from their previous PCP. The patient voiced understanding and agreement to the plan. Education provided today during visit and on AVS for patient to review at home.  Diet and Exercise recommendations provided.  Current diagnoses and recommendations discussed. HM recommendations reviewed with  recommendations.   Patient should return ***. No follow-ups on file.   Harlene LITTIE Jolly, DNP, AGNP-C 11/14/23  10:55 AM

## 2023-11-15 ENCOUNTER — Other Ambulatory Visit

## 2023-11-15 ENCOUNTER — Encounter: Payer: Self-pay | Admitting: Student

## 2023-11-15 ENCOUNTER — Ambulatory Visit: Admitting: Student

## 2023-11-15 DIAGNOSIS — Z7689 Persons encountering health services in other specified circumstances: Secondary | ICD-10-CM

## 2023-11-15 NOTE — Progress Notes (Signed)
 Pt no show- No Charge- Chart error

## 2023-11-29 ENCOUNTER — Ambulatory Visit (INDEPENDENT_AMBULATORY_CARE_PROVIDER_SITE_OTHER): Admitting: Podiatry

## 2023-11-29 DIAGNOSIS — Q666 Other congenital valgus deformities of feet: Secondary | ICD-10-CM

## 2023-11-29 DIAGNOSIS — L97512 Non-pressure chronic ulcer of other part of right foot with fat layer exposed: Secondary | ICD-10-CM | POA: Diagnosis not present

## 2023-11-29 NOTE — Progress Notes (Unsigned)
 Subjective:  Patient ID: Ernest Edwards, male    DOB: 06/25/1960,  MRN: [id number]  Chief Complaint  Patient presents with   Foot Ulcer    Right foot ulcer follow up     63 y.o. male presents for wound care.  Patient presents with follow-up of right submetatarsal 1 ulceration he states is doing a little bit better has been doing iodine dressings denies any other acute complaints.   Review of Systems: Negative except as noted in the HPI. Denies N/V/F/Ch.  Past Medical History:  Diagnosis Date   Diabetes mellitus without complication (HCC)    Hypertension     Current Outpatient Medications:    amLODipine  (NORVASC ) 10 MG tablet, Take 1 tablet (10 mg total) by mouth daily., Disp: 30 tablet, Rfl: 0   atorvastatin  (LIPITOR ) 80 MG tablet, Take 1 tablet (80 mg total) by mouth daily., Disp: 30 tablet, Rfl: 5   chlorthalidone  (HYGROTON ) 25 MG tablet, Take 1 tablet (25 mg total) by mouth daily., Disp: 30 tablet, Rfl: 5   Glucose Blood (BLOOD GLUCOSE TEST STRIPS) STRP, Check blood sugars 2 times daily.  Diagnosis E11.9 patient on insulin ., Disp: 100 strip, Rfl: 3   metFORMIN  (GLUCOPHAGE -XR) 500 MG 24 hr tablet, Take 2 tablets (1,000 mg total) by mouth 2 (two) times daily with a meal., Disp: 120 tablet, Rfl: 0   OVER THE COUNTER MEDICATION, Take 1 capsule by mouth daily. CLA- body fat reduction, Disp: , Rfl:    sildenafil  (VIAGRA ) 100 MG tablet, Take 1 tablet (100 mg total) by mouth daily as needed for erectile dysfunction., Disp: 10 tablet, Rfl: 1   valsartan  (DIOVAN ) 320 MG tablet, Take 1 tablet (320 mg total) by mouth daily., Disp: 30 tablet, Rfl: 5  Social History   Tobacco Use  Smoking Status Never  Smokeless Tobacco Never    No Known Allergies Objective:  There were no vitals filed for this visit. There is no height or weight on file to calculate BMI. Constitutional Well developed. Well nourished.  Vascular Dorsalis pedis pulses palpable bilaterally. Posterior tibial pulses  palpable bilaterally. Capillary refill normal to all digits.  No cyanosis or clubbing noted. Pedal hair growth normal.  Neurologic Normal speech. Oriented to person, place, and time. Protective sensation absent  Dermatologic Right submet 1 ulceration no longer there.  Ulceration completely epithelialized no signs of recurrence noted.  No other abnormality of the skin noted  Orthopedic: No pain to palpation either foot.   Radiographs: None Assessment:   1. Right foot ulcer, with fat layer exposed (HCC)   2. Pes planovalgus      Plan:  Patient was evaluated and treated and all questions answered.  Ulcer right 1 ulceration with fat layer exposed Betadine - Clinically healed skin completely epithelialized no signs of ulceration noted.  Encouraged him to make shoe gear modification wears orthotics he states understanding will do soak.  If any foot and ankle issues arise in future he will come back and see me.  Pes planovalgus -I explained to patient the etiology of pes planovalgus and relationship with Planter fasciitis and various treatment options were discussed.  Given patient foot structure in the setting of Planter fasciitis I believe patient will benefit from custom-made orthotics to help control the hindfoot motion support the arch of the foot and take the stress away from plantar fascial.  Patient agrees with the plan like to proceed with orthotics -Patient was casted for orthotics   No follow-ups on file.

## 2024-01-10 ENCOUNTER — Ambulatory Visit: Admitting: Podiatry

## 2024-05-22 ENCOUNTER — Ambulatory Visit (HOSPITAL_COMMUNITY)
Admission: EM | Admit: 2024-05-22 | Discharge: 2024-05-22 | Disposition: A | Discharge status: Home / Self Care | Source: Home / Self Care

## 2024-05-22 ENCOUNTER — Ambulatory Visit: Admitting: Podiatry

## 2024-05-22 ENCOUNTER — Encounter (HOSPITAL_COMMUNITY): Payer: Self-pay

## 2024-05-22 DIAGNOSIS — E1165 Type 2 diabetes mellitus with hyperglycemia: Secondary | ICD-10-CM | POA: Diagnosis not present

## 2024-05-22 DIAGNOSIS — Z794 Long term (current) use of insulin: Secondary | ICD-10-CM | POA: Diagnosis not present

## 2024-05-22 DIAGNOSIS — I10 Essential (primary) hypertension: Secondary | ICD-10-CM

## 2024-05-22 DIAGNOSIS — L97512 Non-pressure chronic ulcer of other part of right foot with fat layer exposed: Secondary | ICD-10-CM

## 2024-05-22 LAB — GLUCOSE, POCT (MANUAL RESULT ENTRY): POCT Glucose (KUC): 285 mg/dL — AB (ref 70–99)

## 2024-05-22 MED ORDER — AMLODIPINE BESYLATE 10 MG PO TABS: 10.0000 mg | ORAL_TABLET | Freq: Every day | ORAL | 0 refills | Status: AC

## 2024-05-22 MED ORDER — CHLORTHALIDONE 25 MG PO TABS: 25.0000 mg | ORAL_TABLET | Freq: Every day | ORAL | 0 refills | Status: AC

## 2024-05-22 MED ORDER — METFORMIN HCL ER 500 MG PO TB24: 500.0000 mg | ORAL_TABLET | Freq: Two times a day (BID) | ORAL | 0 refills | Status: AC

## 2024-05-22 NOTE — Progress Notes (Signed)
 "  Subjective:  Patient ID: Ernest Edwards, male    DOB: 06/04/1960,  MRN: [id number]  Chief Complaint  Patient presents with   Foot Ulcer    64 y.o. male presents for wound care.  Patient presents with follow-up of right submetatarsal 1 ulceration he states that started coming back and started noticing more more discomfort wanted to discuss and evaluate.   Review of Systems: Negative except as noted in the HPI. Denies N/V/F/Ch.  Past Medical History:  Diagnosis Date   Diabetes mellitus without complication (HCC)    Hypertension     Current Outpatient Medications:    amLODipine  (NORVASC ) 10 MG tablet, Take 1 tablet (10 mg total) by mouth daily., Disp: 30 tablet, Rfl: 0   atorvastatin  (LIPITOR ) 80 MG tablet, Take 1 tablet (80 mg total) by mouth daily., Disp: 30 tablet, Rfl: 5   [START ON 05/25/2024] chlorthalidone  (HYGROTON ) 25 MG tablet, Take 1 tablet (25 mg total) by mouth daily., Disp: 30 tablet, Rfl: 0   Glucose Blood (BLOOD GLUCOSE TEST STRIPS) STRP, Check blood sugars 2 times daily.  Diagnosis E11.9 patient on insulin ., Disp: 100 strip, Rfl: 3   metFORMIN  (GLUCOPHAGE -XR) 500 MG 24 hr tablet, Take 1 tablet (500 mg total) by mouth 2 (two) times daily with a meal., Disp: 60 tablet, Rfl: 0   OVER THE COUNTER MEDICATION, Take 1 capsule by mouth daily. CLA- body fat reduction, Disp: , Rfl:    sildenafil  (VIAGRA ) 100 MG tablet, Take 1 tablet (100 mg total) by mouth daily as needed for erectile dysfunction., Disp: 10 tablet, Rfl: 1   valsartan  (DIOVAN ) 320 MG tablet, Take 1 tablet (320 mg total) by mouth daily., Disp: 30 tablet, Rfl: 5  Social History   Tobacco Use  Smoking Status Never  Smokeless Tobacco Never    No Known Allergies Objective:  There were no vitals filed for this visit. There is no height or weight on file to calculate BMI. Constitutional Well developed. Well nourished.  Vascular Dorsalis pedis pulses palpable bilaterally. Posterior tibial pulses palpable  bilaterally. Capillary refill normal to all digits.  No cyanosis or clubbing noted. Pedal hair growth normal.  Neurologic Normal speech. Oriented to person, place, and time. Protective sensation absent  Dermatologic Wound Location: Right submet 1 ulceration with fat layer exposed.  No purulent drainage noted.  Macerated noted.  No probing down to deep tissue noted.  No malodor present Wound Base: Mixed Granular/Fibrotic Peri-wound: Calloused Exudate: Scant/small amount Serosanguinous exudate Wound Measurements: - See above  Orthopedic: No pain to palpation either foot.   Radiographs: None Assessment:   1. Right foot ulcer, with fat layer exposed (HCC)      Plan:  Patient was evaluated and treated and all questions answered.  Ulcer right 1 ulceration with fat layer exposed Betadine -Debridement as below. -Dressed with pain Betadine wet-to-dry, DSD. -Continue off-loading with cam boot - Patient is a high risk of undergoing amputation given his nature of diabetes.  Discussed glucose control and shoe gear modification  Procedure: Excisional Debridement of Wound~improving Tool: Sharp chisel blade/tissue nipper Rationale: Removal of non-viable soft tissue from the wound to promote healing.  Anesthesia: none Pre-Debridement Wound Measurements: 1.5 cm x 0.5 cm x 0.3 cm  Post-Debridement Wound Measurements: 1.6 cm x 0.6 cm x 0.3 cm  Type of Debridement: Sharp Excisional Tissue Removed: Non-viable soft tissue Blood loss: Minimal (<50cc) Depth of Debridement: subcutaneous tissue. Technique: Sharp excisional debridement to bleeding, viable wound base.  Wound Progress: This is  my initial evaluation of continue monitor the progression of the wound Site healing conversation 7 Dressing: Dry, sterile, compression dressing. Disposition: Patient tolerated procedure well. Patient to return in 1 week for follow-up.  Pes planovalgus -I explained to patient the etiology of pes planovalgus  and relationship with Planter fasciitis and various treatment options were discussed.  Given patient foot structure in the setting of Planter fasciitis I believe patient will benefit from custom-made orthotics to help control the hindfoot motion support the arch of the foot and take the stress away from plantar fascial.  Patient agrees with the plan like to proceed with orthotics -Patient was casted for orthotics   No follow-ups on file.    "

## 2024-05-22 NOTE — ED Provider Notes (Signed)
 " [facility]    CSN: [id number] Arrival date & time: 05/22/24  1058      History   Chief Complaint No chief complaint on file.   HPI Ernest Edwards is a 64 y.o. male.   This 64 year old male is being seen for concerns regarding blood pressure and glucose levels.  He has has not had a primary care provider in several months.  He has been out of medications for several months.  He is very hypertensive today.  He denies headache, dizziness, vision disturbance.  He denies chest pain, shortness of breath, abdominal pain, nausea, vomiting.  Nursing has initiated PCP assistance.  Per chart review, his last prescription was filled in July.  He says he received 30-day supply and has been out of medications since that time.  He has attempted to eat healthy, decrease processed meats, decrease salt intake.     Past Medical History:  Diagnosis Date   Diabetes mellitus without complication (HCC)    Hypertension     Patient Active Problem List   Diagnosis Date Noted   Uncontrolled type 2 diabetes mellitus with hyperglycemia, with long-term current use of insulin  (HCC) 06/18/2021   Essential hypertension 06/18/2021   AKI (acute kidney injury) 06/18/2021   Dyslipidemia 06/18/2021   Obesity, Class II, BMI 35-39.9 06/18/2021   Persistent proteinuria associated with type 2 diabetes mellitus (HCC) 08/23/2019   ED (erectile dysfunction) 08/21/2019   Deformity of left foot 08/16/2019   History of amputation of lesser toe, left 08/16/2019   Type 2 diabetes mellitus with circulatory disorder, with long-term current use of insulin  (HCC) 08/18/2016   Back pain 03/12/2014   Family history of premature CAD 03/12/2014   Hypertension associated with diabetes (HCC) 03/12/2014    Past Surgical History:  Procedure Laterality Date   AMPUTATION Left 06/20/2021   Procedure: Partial First Ray Amputation, left foot;  Surgeon: Tobie Franky SQUIBB, DPM;  Location: [facility] OR;  Service: Podiatry;   Laterality: Left;       Home Medications    Prior to Admission medications  Medication Sig Start Date End Date Taking? Authorizing Provider  amLODipine  (NORVASC ) 10 MG tablet Take 1 tablet (10 mg total) by mouth daily. 05/22/24 06/21/24 Yes Art Levan C, FNP  chlorthalidone  (HYGROTON ) 25 MG tablet Take 1 tablet (25 mg total) by mouth daily. 05/25/24 06/24/24 Yes Donnelle Olmeda C, FNP  metFORMIN  (GLUCOPHAGE -XR) 500 MG 24 hr tablet Take 1 tablet (500 mg total) by mouth 2 (two) times daily with a meal. 05/22/24 06/21/24 Yes Adelisa Satterwhite, Jon BROCKS, FNP  atorvastatin  (LIPITOR ) 80 MG tablet Take 1 tablet (80 mg total) by mouth daily. 11/10/22   Colette Torrence GRADE, MD  Glucose Blood (BLOOD GLUCOSE TEST STRIPS) STRP Check blood sugars 2 times daily.  Diagnosis E11.9 patient on insulin . 05/25/22   Colette Torrence GRADE, MD  OVER THE COUNTER MEDICATION Take 1 capsule by mouth daily. CLA- body fat reduction    [provider]  sildenafil  (VIAGRA ) 100 MG tablet Take 1 tablet (100 mg total) by mouth daily as needed for erectile dysfunction. 11/10/22   Colette Torrence GRADE, MD  valsartan  (DIOVAN ) 320 MG tablet Take 1 tablet (320 mg total) by mouth daily. 11/10/22   Colette Torrence GRADE, MD  hydrochlorothiazide  (MICROZIDE ) 12.5 MG capsule Take 1 capsule (12.5 mg total) by mouth daily. 02/21/14 05/20/19  Nasario Moats, PA-C  lisinopril  (ZESTRIL ) 10 MG tablet Take 1 tablet (10 mg total) by mouth daily. 05/20/19 05/20/19  Adah Corning  A, FNP    Family History Family History  Problem Relation Age of Onset   Hypertension Mother    Diabetes Mother    Diabetes Father    Hypertension Father    Heart attack Sister    Heart attack Brother     Social History Social History[1]   Allergies   Patient has no known allergies.   Review of Systems Review of Systems  Constitutional:  Negative for activity change, appetite change, chills, fatigue and fever.  Eyes:  Negative for visual disturbance.  Respiratory:  Negative for shortness  of breath.   Cardiovascular:  Negative for chest pain.  Gastrointestinal:  Negative for abdominal pain, nausea and vomiting.  Genitourinary:  Negative for decreased urine volume, dysuria, enuresis, frequency and urgency.  Skin:  Negative for color change and rash.  Neurological:  Negative for dizziness, weakness, numbness and headaches.  All other systems reviewed and are negative.    Physical Exam Triage Vital Signs ED Triage Vitals [05/22/24 1119]  Encounter Vitals Group     BP (!) 211/122     Girls Systolic BP Percentile      Girls Diastolic BP Percentile      Boys Systolic BP Percentile      Boys Diastolic BP Percentile      Pulse Rate 85     Resp 16     Temp 98.4 F (36.9 C)     Temp Source Oral     SpO2 94 %     Weight      Height      Head Circumference      Peak Flow      Pain Score      Pain Loc      Pain Education      Exclude from Growth Chart    No data found.  Updated Vital Signs BP (!) 210/124 (BP Location: Left Arm)   Pulse 85   Temp 98.4 F (36.9 C) (Oral)   Resp 16   SpO2 94%   Visual Acuity Right Eye Distance:   Left Eye Distance:   Bilateral Distance:    Right Eye Near:   Left Eye Near:    Bilateral Near:     Physical Exam Vitals and nursing note reviewed.  Constitutional:      General: He is not in acute distress.    Appearance: He is well-developed. He is not toxic-appearing.     Comments: Male appearing stated age found sitting in chair in no acute distress.  HENT:     Head: Normocephalic and atraumatic.     Mouth/Throat:     Lips: Pink.     Mouth: Mucous membranes are moist.  Eyes:     Conjunctiva/sclera: Conjunctivae normal.  Cardiovascular:     Rate and Rhythm: Normal rate and regular rhythm.     Heart sounds: Normal heart sounds. No murmur heard. Pulmonary:     Effort: Pulmonary effort is normal. No respiratory distress.     Breath sounds: Normal breath sounds.  Abdominal:     General: Bowel sounds are normal.      Palpations: Abdomen is soft.     Tenderness: There is no abdominal tenderness.  Musculoskeletal:     Cervical back: Neck supple.  Skin:    General: Skin is warm and dry.     Capillary Refill: Capillary refill takes less than 2 seconds.  Neurological:     Mental Status: He is alert and oriented to person, place, and  time.  Psychiatric:        Attention and Perception: Attention normal.        Mood and Affect: Mood normal.        Speech: Speech normal.        Behavior: Behavior normal.        Thought Content: Thought content normal.        Cognition and Memory: Cognition normal.        Judgment: Judgment normal.      UC Treatments / Results  Labs (all labs ordered are listed, but only abnormal results are displayed) Labs Reviewed  GLUCOSE, POCT (MANUAL RESULT ENTRY) - Abnormal; Notable for the following components:      Result Value   POCT Glucose ([facility]) 285 (*)    All other components within normal limits    EKG   Radiology No results found.  Procedures Procedures (including critical care time)  Medications Ordered in UC Medications - No data to display  Initial Impression / Assessment and Plan / UC Course  I have reviewed the triage vital signs and the nursing notes.  Pertinent labs & imaging results that were available during my care of the patient were reviewed by me and considered in my medical decision making (see chart for details).     Vitals and triage reviewed.  Patient is hypertensive.  There are no neurodeficits.  His glucose level is 283.  He has not taken his medications in several months.  He was set up with primary care back in July, but was a no-show for that appointment.  He has not seen primary care since the spring 2025.  Today he is given prescription for amlodipine  to start today, chlorthalidone  to start Saturday, metformin  to start today.  Primary care assistance is initiated again today.  Extensive discussion regarding the importance of keeping  primary care appointments for management of his chronic medical conditions.  He verbalizes understanding.  Plan of care, follow-up care, return precautions given, no questions at this time. Final Clinical Impressions(s) / UC Diagnoses   Final diagnoses:  Uncontrolled type 2 diabetes mellitus with hyperglycemia, with long-term current use of insulin  [facility])  Essential hypertension     Discharge Instructions      Your blood pressure is elevated in clinic today.  You are being set up with primary care provider.  It is important that you follow-up with primary care.  We have refilled the following medications: Amlodipine , take 1 tablet daily starting today. Chlorthalidone , take 1 tablet a day starting Saturday. Metformin , take 2 tablets twice a day with meals starting today.  If you develop new or worsening symptoms, return here or follow-up with your primary care provider.  If your symptoms are severe, please go to the emergency department.     ED Prescriptions     Medication Sig Dispense Auth. Provider   amLODipine  (NORVASC ) 10 MG tablet Take 1 tablet (10 mg total) by mouth daily. 30 tablet Nichols Corter C, FNP   chlorthalidone  (HYGROTON ) 25 MG tablet Take 1 tablet (25 mg total) by mouth daily. 30 tablet Aeon Kessner C, FNP   metFORMIN  (GLUCOPHAGE -XR) 500 MG 24 hr tablet Take 1 tablet (500 mg total) by mouth 2 (two) times daily with a meal. 60 tablet Lennice Jon BROCKS, FNP      PDMP not reviewed this encounter.    [1]  Social History Tobacco Use   Smoking status: Never   Smokeless tobacco: Never  Vaping Use   Vaping  status: Never Used  Substance Use Topics   Alcohol use: No   Drug use: No     Lennice Jon BROCKS, FNP 05/22/24 1211  "

## 2024-05-22 NOTE — Discharge Instructions (Addendum)
 Your blood pressure is elevated in clinic today.  You are being set up with primary care provider.  It is important that you follow-up with primary care.  We have refilled the following medications: Amlodipine , take 1 tablet daily starting today. Chlorthalidone , take 1 tablet a day starting Saturday. Metformin , take 2 tablets twice a day with meals starting today.  If you develop new or worsening symptoms, return here or follow-up with your primary care provider.  If your symptoms are severe, please go to the emergency department.

## 2024-05-22 NOTE — ED Triage Notes (Signed)
 Pt presents to the office for blood pressure check and blood sugar. Patient states he has been out of medication for several months.   New patient appt has been scheduled.

## 2024-06-26 ENCOUNTER — Ambulatory Visit: Admitting: Podiatry

## 2024-06-26 ENCOUNTER — Encounter: Admitting: Internal Medicine
